# Patient Record
Sex: Female | Born: 1945
Health system: Southern US, Community
[De-identification: ages and names within clinical notes are randomized; demographics above are authoritative.]

## PROBLEM LIST (undated history)

## (undated) DIAGNOSIS — J189 Pneumonia, unspecified organism: Secondary | ICD-10-CM

## (undated) DIAGNOSIS — J449 Chronic obstructive pulmonary disease, unspecified: Secondary | ICD-10-CM

## (undated) HISTORY — PX: APPENDECTOMY: SHX54

## (undated) HISTORY — PX: FRACTURE SURGERY: SHX138

## (undated) HISTORY — PX: TUBAL LIGATION: SHX77

---

## 2000-10-29 ENCOUNTER — Encounter: Payer: Self-pay | Admitting: Otolaryngology

## 2000-10-29 ENCOUNTER — Ambulatory Visit (HOSPITAL_COMMUNITY): Admission: RE | Admit: 2000-10-29 | Discharge: 2000-10-29 | Payer: Self-pay | Admitting: Otolaryngology

## 2001-03-27 ENCOUNTER — Encounter: Payer: Self-pay | Admitting: *Deleted

## 2001-03-27 ENCOUNTER — Emergency Department (HOSPITAL_COMMUNITY): Admission: EM | Admit: 2001-03-27 | Discharge: 2001-03-27 | Payer: Self-pay | Admitting: *Deleted

## 2001-03-29 ENCOUNTER — Inpatient Hospital Stay (HOSPITAL_COMMUNITY): Admission: EM | Admit: 2001-03-29 | Discharge: 2001-03-30 | Payer: Self-pay | Admitting: *Deleted

## 2001-04-01 ENCOUNTER — Other Ambulatory Visit (HOSPITAL_COMMUNITY): Admission: RE | Admit: 2001-04-01 | Discharge: 2001-06-30 | Payer: Self-pay | Admitting: *Deleted

## 2002-12-01 ENCOUNTER — Encounter: Payer: Self-pay | Admitting: Urology

## 2002-12-01 ENCOUNTER — Ambulatory Visit (HOSPITAL_COMMUNITY): Admission: RE | Admit: 2002-12-01 | Discharge: 2002-12-01 | Payer: Self-pay | Admitting: Urology

## 2002-12-15 ENCOUNTER — Encounter: Payer: Self-pay | Admitting: Urology

## 2002-12-15 ENCOUNTER — Ambulatory Visit (HOSPITAL_COMMUNITY): Admission: RE | Admit: 2002-12-15 | Discharge: 2002-12-15 | Payer: Self-pay

## 2003-06-30 ENCOUNTER — Ambulatory Visit (HOSPITAL_COMMUNITY): Admission: RE | Admit: 2003-06-30 | Discharge: 2003-06-30 | Payer: Self-pay | Admitting: Urology

## 2006-05-13 ENCOUNTER — Emergency Department (HOSPITAL_COMMUNITY): Admission: EM | Admit: 2006-05-13 | Discharge: 2006-05-14 | Payer: Self-pay | Admitting: Emergency Medicine

## 2014-07-17 DIAGNOSIS — Z136 Encounter for screening for cardiovascular disorders: Secondary | ICD-10-CM | POA: Diagnosis not present

## 2014-07-17 DIAGNOSIS — Z6820 Body mass index (BMI) 20.0-20.9, adult: Secondary | ICD-10-CM | POA: Diagnosis not present

## 2014-07-17 DIAGNOSIS — Z0000 Encounter for general adult medical examination without abnormal findings: Secondary | ICD-10-CM | POA: Diagnosis not present

## 2014-07-17 DIAGNOSIS — Z13220 Encounter for screening for lipoid disorders: Secondary | ICD-10-CM | POA: Diagnosis not present

## 2014-07-20 ENCOUNTER — Other Ambulatory Visit (HOSPITAL_COMMUNITY): Payer: Self-pay | Admitting: Family Medicine

## 2014-07-20 DIAGNOSIS — Z0000 Encounter for general adult medical examination without abnormal findings: Secondary | ICD-10-CM

## 2014-07-23 ENCOUNTER — Other Ambulatory Visit (HOSPITAL_COMMUNITY): Payer: Self-pay | Admitting: Family Medicine

## 2014-07-23 DIAGNOSIS — N951 Menopausal and female climacteric states: Secondary | ICD-10-CM

## 2014-07-23 DIAGNOSIS — Z0000 Encounter for general adult medical examination without abnormal findings: Secondary | ICD-10-CM

## 2014-07-28 ENCOUNTER — Other Ambulatory Visit (HOSPITAL_COMMUNITY): Payer: Self-pay

## 2014-07-31 ENCOUNTER — Other Ambulatory Visit (HOSPITAL_COMMUNITY): Payer: Self-pay

## 2014-12-17 DIAGNOSIS — I781 Nevus, non-neoplastic: Secondary | ICD-10-CM | POA: Diagnosis not present

## 2014-12-17 DIAGNOSIS — L723 Sebaceous cyst: Secondary | ICD-10-CM | POA: Diagnosis not present

## 2014-12-17 DIAGNOSIS — L309 Dermatitis, unspecified: Secondary | ICD-10-CM | POA: Diagnosis not present

## 2016-02-05 ENCOUNTER — Emergency Department (HOSPITAL_COMMUNITY): Payer: Medicare Other

## 2016-02-05 ENCOUNTER — Encounter (HOSPITAL_COMMUNITY): Payer: Self-pay

## 2016-02-05 ENCOUNTER — Emergency Department (HOSPITAL_COMMUNITY)
Admission: EM | Admit: 2016-02-05 | Discharge: 2016-02-05 | Disposition: A | Discharge status: Home / Self Care | Payer: Medicare Other | Attending: Emergency Medicine | Admitting: Emergency Medicine

## 2016-02-05 DIAGNOSIS — F17210 Nicotine dependence, cigarettes, uncomplicated: Secondary | ICD-10-CM | POA: Insufficient documentation

## 2016-02-05 DIAGNOSIS — Y998 Other external cause status: Secondary | ICD-10-CM | POA: Insufficient documentation

## 2016-02-05 DIAGNOSIS — Y9368 Activity, volleyball (beach) (court): Secondary | ICD-10-CM | POA: Insufficient documentation

## 2016-02-05 DIAGNOSIS — S59292A Other physeal fracture of lower end of radius, left arm, initial encounter for closed fracture: Secondary | ICD-10-CM | POA: Diagnosis not present

## 2016-02-05 DIAGNOSIS — Y929 Unspecified place or not applicable: Secondary | ICD-10-CM | POA: Insufficient documentation

## 2016-02-05 DIAGNOSIS — Z7982 Long term (current) use of aspirin: Secondary | ICD-10-CM | POA: Diagnosis not present

## 2016-02-05 DIAGNOSIS — S52572A Other intraarticular fracture of lower end of left radius, initial encounter for closed fracture: Secondary | ICD-10-CM | POA: Insufficient documentation

## 2016-02-05 DIAGNOSIS — W19XXXA Unspecified fall, initial encounter: Secondary | ICD-10-CM | POA: Insufficient documentation

## 2016-02-05 DIAGNOSIS — Z79899 Other long term (current) drug therapy: Secondary | ICD-10-CM | POA: Insufficient documentation

## 2016-02-05 DIAGNOSIS — S62102A Fracture of unspecified carpal bone, left wrist, initial encounter for closed fracture: Secondary | ICD-10-CM

## 2016-02-05 DIAGNOSIS — S6992XA Unspecified injury of left wrist, hand and finger(s), initial encounter: Secondary | ICD-10-CM | POA: Diagnosis present

## 2016-02-05 MED ORDER — HYDROCODONE-ACETAMINOPHEN 5-325 MG PO TABS
1.0000 | ORAL_TABLET | Freq: Once | ORAL | Status: AC
  Administered 2016-02-05: 1 via ORAL
  Filled 2016-02-05: qty 1

## 2016-02-05 MED ORDER — HYDROCODONE-ACETAMINOPHEN 5-325 MG PO TABS: ORAL_TABLET | ORAL | 0 refills | Status: DC

## 2016-02-05 NOTE — ED Provider Notes (Signed)
[facility] DEPT Provider Note   CSN: [id number] Arrival date & time: 02/05/16  1859     History   Chief Complaint Chief Complaint  Patient presents with  . Wrist Pain    HPI Kara Smith is a 70 y.o. female.  HPI  Kara Smith is a 70 y.o. female who presents to the Emergency Department complaining of left wrist pain after a mechanical fall onto an outstretched hand.  patient states she was playing volleyball at the time of the accident.  She states that she wrapped her wrist in an ice pack and took a NSAID prior to arrival.  She reports swelling of the wrist and pain to the medial wrist that's worse with movement.  She denies numbness or pain of the fingers, elbow pain or other injuries. Also denies anti-coagulants.  History reviewed. No pertinent past medical history.  There are no active problems to display for this patient.   Past Surgical History:  Procedure Laterality Date  . APPENDECTOMY      OB History    No data available       Home Medications    Prior to Admission medications   Medication Sig Start Date End Date Taking? Authorizing Provider  aspirin EC 325 MG tablet Take 325 mg by mouth daily.   Yes Historical Provider, MD  Multiple Vitamin (MULTIVITAMIN WITH MINERALS) TABS tablet Take 1 tablet by mouth daily.   Yes Historical Provider, MD  naproxen sodium (ALEVE) 220 MG tablet Take 220-440 mg by mouth daily as needed (for pain).   Yes Historical Provider, MD  Omega-3 Fatty Acids (FISH OIL) 1000 MG CAPS Take 1 capsule by mouth daily.   Yes Historical Provider, MD  SUPER B COMPLEX/C PO Take 1 tablet by mouth daily.   Yes Historical Provider, MD    Family History No family history on file.  Social History Social History  Substance Use Topics  . Smoking status: Current Every Day Smoker    Packs/day: 0.50    Types: Cigarettes  . Smokeless tobacco: Never Used  . Alcohol use No     Allergies   Sulfa antibiotics   Review of  Systems Review of Systems  Constitutional: Negative for chills and fever.  Musculoskeletal: Positive for arthralgias (left wrist pain) and joint swelling. Negative for back pain and neck pain.  Skin: Negative for color change and wound.  Neurological: Negative for syncope, weakness and numbness.  All other systems reviewed and are negative.    Physical Exam Updated Vital Signs BP 149/74 (BP Location: Left Arm)   Pulse 83   Temp 98.2 F (36.8 C)   Resp 20   Ht 5\' 7"  (1.702 m)   Wt 54.4 kg   SpO2 100%   BMI 18.79 kg/m   Physical Exam  Constitutional: She is oriented to person, place, and time. She appears well-developed and well-nourished. No distress.  HENT:  Head: Normocephalic and atraumatic.  Cardiovascular: Normal rate, regular rhythm and intact distal pulses.   Pulmonary/Chest: Effort normal and breath sounds normal.  Musculoskeletal: She exhibits edema and tenderness.  ttp and edema of the radial aspect of the left wrist.   Radial pulse is brisk, distal sensation intact.  CR< 2 sec.  No bruising or open wounds.  Compartments soft.  Neurological: She is alert and oriented to person, place, and time. She exhibits normal muscle tone. Coordination normal.  Skin: Skin is warm and dry.  Nursing note and vitals reviewed.    ED  Treatments / Results  Labs (all labs ordered are listed, but only abnormal results are displayed) Labs Reviewed - No data to display  EKG  EKG Interpretation None       Radiology Dg Wrist Complete Left  Result Date: 02/05/2016 CLINICAL DATA:  Larey SeatFell onto LEFT wrist while playing volleyball today, pain and swelling LEFT wrist EXAM: LEFT WRIST - COMPLETE 3+ VIEW COMPARISON:  None FINDINGS: Diffuse osseous demineralization. Joint spaces preserved. Ossicle seen adjacent to tip of ulnar styloid, appears corticated, question non fused ossicle versus sequela of remote injury. Comminuted distal LEFT radial metaphyseal fracture with dorsal tilt of distal  radial articular surface, displacement of fragments, and intra-articular extension at the radiocarpal joint. No additional fracture, dislocation, or bone destruction. Associated soft tissue swelling. IMPRESSION: Comminuted displaced intra-articular distal LEFT radial metaphyseal fracture. Non fused ossicle versus nonunion of an old ulnar styloid fragment, appears old. Electronically Signed   By: Ulyses SouthwardMark  Boles M.D.   On: 02/05/2016 20:04    Procedures Procedures (including critical care time)  Medications Ordered in ED Medications  HYDROcodone-acetaminophen (NORCO/VICODIN) 5-325 MG per tablet 1 tablet (not administered)     Initial Impression / Assessment and Plan / ED Course  I have reviewed the triage vital signs and the nursing notes.  Pertinent labs & imaging results that were available during my care of the patient were reviewed by me and considered in my medical decision making (see chart for details).  Clinical Course   Pt well appearing, no distress.  XR results discussed with patient.  NV intact  ED attending, Dr. Hyacinth MeekerMiller consulted hand surgeon on call, Dr. Janee Mornhompson and care plan discussed to include sugar tong splint, sling and Dr. Carollee Massedhompson's office to contact pt to set up f/u  Sugar tong splint applied, sling given,  Remains NV intact.    Final Clinical Impressions(s) / ED Diagnoses   Final diagnoses:  Wrist fracture, left, closed, initial encounter    New Prescriptions Discharge Medication List as of 02/05/2016  9:32 PM    START taking these medications   Details  HYDROcodone-acetaminophen (NORCO/VICODIN) 5-325 MG tablet Take one tab po q 4-6 hrs prn pain, Print         Pauline Ausammy Moani Weipert, PA-C 02/07/16 1237    Eber HongBrian Miller, MD 02/09/16 1006

## 2016-02-05 NOTE — ED Triage Notes (Signed)
Patient states she was playing volleyball today and fell onto her left wrist. C/o pain and swelling to left wrist.

## 2016-02-05 NOTE — Discharge Instructions (Signed)
Elevate and apply ice packs on/off to your wrist.  Keep it splinted.  Dr. Carollee Massedhompson's office will call you on Monday to arrange a follow-up appt.

## 2016-02-05 NOTE — ED Provider Notes (Signed)
The patient fell on an outstretched hand while she was playing volleyball, had acute onset of pain deformity and swelling around the distal left wrist. On exam the patient is tender, decreased range of motion, x-rays reviewed, they show comminuted intra-articular left distal radius fracture. I discussed the case with Dr. Janee Mornhompson, he will have the office contact the patient for close follow-up.  Sugar tong, f/u - pt in agreement  RICE therapy.  Medical screening examination/treatment/procedure(s) were conducted as a shared visit with non-physician practitioner(s) and myself.  I personally evaluated the patient during the encounter.  Clinical Impression:   Final diagnoses:  Wrist fracture, left, closed, initial encounter         Eber HongBrian Alexismarie Flaim, MD 02/09/16 1005

## 2016-02-07 ENCOUNTER — Other Ambulatory Visit: Payer: Self-pay | Admitting: Orthopedic Surgery

## 2016-02-07 DIAGNOSIS — S52572A Other intraarticular fracture of lower end of left radius, initial encounter for closed fracture: Secondary | ICD-10-CM | POA: Diagnosis not present

## 2016-02-08 ENCOUNTER — Ambulatory Visit (HOSPITAL_BASED_OUTPATIENT_CLINIC_OR_DEPARTMENT_OTHER)
Admission: RE | Admit: 2016-02-08 | Discharge: 2016-02-08 | Disposition: A | Discharge status: Home / Self Care | Payer: Medicare Other | Source: Ambulatory Visit | Attending: Orthopedic Surgery | Admitting: Orthopedic Surgery

## 2016-02-08 ENCOUNTER — Ambulatory Visit (HOSPITAL_BASED_OUTPATIENT_CLINIC_OR_DEPARTMENT_OTHER): Payer: Medicare Other | Admitting: Anesthesiology

## 2016-02-08 ENCOUNTER — Encounter (HOSPITAL_BASED_OUTPATIENT_CLINIC_OR_DEPARTMENT_OTHER)
Admission: RE | Disposition: A | Discharge status: Home / Self Care | Payer: Self-pay | Source: Ambulatory Visit | Attending: Orthopedic Surgery

## 2016-02-08 ENCOUNTER — Ambulatory Visit (HOSPITAL_COMMUNITY): Payer: Medicare Other

## 2016-02-08 ENCOUNTER — Encounter (HOSPITAL_BASED_OUTPATIENT_CLINIC_OR_DEPARTMENT_OTHER): Payer: Self-pay | Admitting: Anesthesiology

## 2016-02-08 DIAGNOSIS — F17210 Nicotine dependence, cigarettes, uncomplicated: Secondary | ICD-10-CM | POA: Diagnosis not present

## 2016-02-08 DIAGNOSIS — T148XXA Other injury of unspecified body region, initial encounter: Secondary | ICD-10-CM

## 2016-02-08 DIAGNOSIS — S52572A Other intraarticular fracture of lower end of left radius, initial encounter for closed fracture: Secondary | ICD-10-CM | POA: Diagnosis not present

## 2016-02-08 DIAGNOSIS — W19XXXA Unspecified fall, initial encounter: Secondary | ICD-10-CM | POA: Insufficient documentation

## 2016-02-08 DIAGNOSIS — Z7982 Long term (current) use of aspirin: Secondary | ICD-10-CM | POA: Insufficient documentation

## 2016-02-08 DIAGNOSIS — X58XXXA Exposure to other specified factors, initial encounter: Secondary | ICD-10-CM | POA: Diagnosis not present

## 2016-02-08 DIAGNOSIS — S52552A Other extraarticular fracture of lower end of left radius, initial encounter for closed fracture: Secondary | ICD-10-CM | POA: Diagnosis not present

## 2016-02-08 DIAGNOSIS — M79632 Pain in left forearm: Secondary | ICD-10-CM | POA: Diagnosis not present

## 2016-02-08 DIAGNOSIS — G8918 Other acute postprocedural pain: Secondary | ICD-10-CM | POA: Diagnosis not present

## 2016-02-08 HISTORY — PX: OPEN REDUCTION INTERNAL FIXATION (ORIF) DISTAL RADIAL FRACTURE: SHX5989

## 2016-02-08 SURGERY — OPEN REDUCTION INTERNAL FIXATION (ORIF) DISTAL RADIUS FRACTURE
Anesthesia: General | Site: Wrist | Laterality: Left

## 2016-02-08 MED ORDER — PROPOFOL 10 MG/ML IV BOLUS
INTRAVENOUS | Status: DC | PRN
  Administered 2016-02-08: 20 mg via INTRAVENOUS
  Administered 2016-02-08: 1150 mg via INTRAVENOUS

## 2016-02-08 MED ORDER — PROMETHAZINE HCL 25 MG/ML IJ SOLN: 6.2500 mg | INTRAMUSCULAR | Status: DC | PRN

## 2016-02-08 MED ORDER — CEFAZOLIN SODIUM-DEXTROSE 2-4 GM/100ML-% IV SOLN
INTRAVENOUS | Status: AC
  Filled 2016-02-08: qty 100

## 2016-02-08 MED ORDER — FENTANYL CITRATE (PF) 100 MCG/2ML IJ SOLN
INTRAMUSCULAR | Status: AC
  Filled 2016-02-08: qty 2

## 2016-02-08 MED ORDER — CEFAZOLIN SODIUM-DEXTROSE 2-4 GM/100ML-% IV SOLN
2.0000 g | INTRAVENOUS | Status: AC
  Administered 2016-02-08: 2 g via INTRAVENOUS

## 2016-02-08 MED ORDER — LIDOCAINE HCL (PF) 1 % IJ SOLN
INTRAMUSCULAR | Status: AC
  Filled 2016-02-08: qty 30

## 2016-02-08 MED ORDER — DEXAMETHASONE SODIUM PHOSPHATE 10 MG/ML IJ SOLN
INTRAMUSCULAR | Status: AC
  Filled 2016-02-08: qty 1

## 2016-02-08 MED ORDER — GLYCOPYRROLATE 0.2 MG/ML IJ SOLN: 0.2000 mg | Freq: Once | INTRAMUSCULAR | Status: DC | PRN

## 2016-02-08 MED ORDER — MEPERIDINE HCL 25 MG/ML IJ SOLN: 6.2500 mg | INTRAMUSCULAR | Status: DC | PRN

## 2016-02-08 MED ORDER — BUPIVACAINE-EPINEPHRINE (PF) 0.5% -1:200000 IJ SOLN
INTRAMUSCULAR | Status: DC | PRN
  Administered 2016-02-08: 30 mL via PERINEURAL

## 2016-02-08 MED ORDER — LACTATED RINGERS IV SOLN: INTRAVENOUS | Status: DC

## 2016-02-08 MED ORDER — SCOPOLAMINE 1 MG/3DAYS TD PT72: 1.0000 | MEDICATED_PATCH | Freq: Once | TRANSDERMAL | Status: DC | PRN

## 2016-02-08 MED ORDER — LACTATED RINGERS IV SOLN
INTRAVENOUS | Status: DC
  Administered 2016-02-08: 11:00:00 via INTRAVENOUS

## 2016-02-08 MED ORDER — BUPIVACAINE-EPINEPHRINE (PF) 0.5% -1:200000 IJ SOLN
INTRAMUSCULAR | Status: AC
  Filled 2016-02-08: qty 30

## 2016-02-08 MED ORDER — MIDAZOLAM HCL 2 MG/2ML IJ SOLN
INTRAMUSCULAR | Status: AC
  Filled 2016-02-08: qty 2

## 2016-02-08 MED ORDER — OXYCODONE HCL 5 MG PO TABS: 5.0000 mg | ORAL_TABLET | Freq: Once | ORAL | Status: DC | PRN

## 2016-02-08 MED ORDER — LIDOCAINE 2% (20 MG/ML) 5 ML SYRINGE
INTRAMUSCULAR | Status: DC | PRN
  Administered 2016-02-08: 60 mg via INTRAVENOUS

## 2016-02-08 MED ORDER — OXYCODONE HCL 5 MG/5ML PO SOLN: 5.0000 mg | Freq: Once | ORAL | Status: DC | PRN

## 2016-02-08 MED ORDER — PROPOFOL 10 MG/ML IV BOLUS
INTRAVENOUS | Status: AC
  Filled 2016-02-08: qty 20

## 2016-02-08 MED ORDER — ONDANSETRON HCL 4 MG/2ML IJ SOLN
INTRAMUSCULAR | Status: AC
  Filled 2016-02-08: qty 2

## 2016-02-08 MED ORDER — MIDAZOLAM HCL 2 MG/2ML IJ SOLN
1.0000 mg | INTRAMUSCULAR | Status: DC | PRN
  Administered 2016-02-08: 1 mg via INTRAVENOUS

## 2016-02-08 MED ORDER — FENTANYL CITRATE (PF) 100 MCG/2ML IJ SOLN
50.0000 ug | INTRAMUSCULAR | Status: DC | PRN
  Administered 2016-02-08: 50 ug via INTRAVENOUS

## 2016-02-08 MED ORDER — HYDROCODONE-ACETAMINOPHEN 5-325 MG PO TABS: ORAL_TABLET | ORAL | 0 refills | Status: DC

## 2016-02-08 MED ORDER — HYDROMORPHONE HCL 1 MG/ML IJ SOLN: 0.2500 mg | INTRAMUSCULAR | Status: DC | PRN

## 2016-02-08 MED ORDER — 0.9 % SODIUM CHLORIDE (POUR BTL) OPTIME
TOPICAL | Status: DC | PRN
  Administered 2016-02-08: 1000 mL

## 2016-02-08 MED ORDER — LIDOCAINE 2% (20 MG/ML) 5 ML SYRINGE
INTRAMUSCULAR | Status: AC
  Filled 2016-02-08: qty 5

## 2016-02-08 SURGICAL SUPPLY — 66 items
BANDAGE COBAN STERILE 2 (GAUZE/BANDAGES/DRESSINGS) IMPLANT
BIT DRILL SOLID 2.0X40MM (BIT) IMPLANT
BIT DRILL SOLID 2.5X40MM (BIT) IMPLANT
BLADE MINI RND TIP GREEN BEAV (BLADE) IMPLANT
BLADE SURG 15 STRL LF DISP TIS (BLADE) ×1 IMPLANT
BLADE SURG 15 STRL SS (BLADE) ×2
BNDG COHESIVE 4X5 TAN STRL (GAUZE/BANDAGES/DRESSINGS) ×3 IMPLANT
BNDG ESMARK 4X9 LF (GAUZE/BANDAGES/DRESSINGS) ×3 IMPLANT
BNDG GAUZE ELAST 4 BULKY (GAUZE/BANDAGES/DRESSINGS) ×3 IMPLANT
BRUSH SCRUB EZ PLAIN DRY (MISCELLANEOUS) IMPLANT
CANISTER SUCT 1200ML W/VALVE (MISCELLANEOUS) ×3 IMPLANT
CHLORAPREP W/TINT 26ML (MISCELLANEOUS) ×3 IMPLANT
CORDS BIPOLAR (ELECTRODE) ×3 IMPLANT
COVER BACK TABLE 60X90IN (DRAPES) ×3 IMPLANT
COVER MAYO STAND STRL (DRAPES) ×3 IMPLANT
CUFF TOURNIQUET SINGLE 18IN (TOURNIQUET CUFF) IMPLANT
CUFF TOURNIQUET SINGLE 24IN (TOURNIQUET CUFF) IMPLANT
DRAPE C-ARM 42X72 X-RAY (DRAPES) ×3 IMPLANT
DRAPE EXTREMITY T 121X128X90 (DRAPE) ×3 IMPLANT
DRAPE SURG 17X23 STRL (DRAPES) ×3 IMPLANT
DRILL SOLID 2.0X40MM (BIT)
DRILL SOLID 2.5X40MM (BIT)
DRILL, CANNULATED, POLYAXIAL LOCKING SCREW 2.0 MM ×3 IMPLANT
DRSG ADAPTIC 3X8 NADH LF (GAUZE/BANDAGES/DRESSINGS) ×3 IMPLANT
DRSG EMULSION OIL 3X3 NADH (GAUZE/BANDAGES/DRESSINGS) IMPLANT
ELECT REM PT RETURN 9FT ADLT (ELECTROSURGICAL) ×3
ELECTRODE REM PT RTRN 9FT ADLT (ELECTROSURGICAL) ×1 IMPLANT
GAUZE SPONGE 4X4 12PLY STRL (GAUZE/BANDAGES/DRESSINGS) ×3 IMPLANT
GLOVE BIO SURGEON STRL SZ7.5 (GLOVE) ×3 IMPLANT
GLOVE BIOGEL PI IND STRL 7.0 (GLOVE) ×1 IMPLANT
GLOVE BIOGEL PI IND STRL 8 (GLOVE) ×1 IMPLANT
GLOVE BIOGEL PI INDICATOR 7.0 (GLOVE) ×2
GLOVE BIOGEL PI INDICATOR 8 (GLOVE) ×2
GLOVE ECLIPSE 6.5 STRL STRAW (GLOVE) ×3 IMPLANT
GOWN STRL REUS W/ TWL LRG LVL3 (GOWN DISPOSABLE) ×2 IMPLANT
GOWN STRL REUS W/TWL LRG LVL3 (GOWN DISPOSABLE) ×4
GOWN STRL REUS W/TWL XL LVL3 (GOWN DISPOSABLE) ×3 IMPLANT
NEEDLE HYPO 25X1 1.5 SAFETY (NEEDLE) IMPLANT
NS IRRIG 1000ML POUR BTL (IV SOLUTION) ×3 IMPLANT
PACK BASIN DAY SURGERY FS (CUSTOM PROCEDURE TRAY) ×3 IMPLANT
PADDING CAST ABS 4INX4YD NS (CAST SUPPLIES)
PADDING CAST ABS COTTON 4X4 ST (CAST SUPPLIES) IMPLANT
PENCIL BUTTON HOLSTER BLD 10FT (ELECTRODE) ×3 IMPLANT
RUBBERBAND STERILE (MISCELLANEOUS) IMPLANT
SKELETAL DYNAMICS DVR SET (Set) ×3 IMPLANT
SLEEVE SCD COMPRESS KNEE MED (MISCELLANEOUS) ×3 IMPLANT
SLING ARM FOAM STRAP LRG (SOFTGOODS) IMPLANT
SPLINT PLASTER CAST XFAST 3X15 (CAST SUPPLIES) IMPLANT
SPLINT PLASTER XTRA FASTSET 3X (CAST SUPPLIES)
STOCKINETTE 6  STRL (DRAPES) ×2
STOCKINETTE 6 STRL (DRAPES) ×1 IMPLANT
SUCTION FRAZIER HANDLE 10FR (MISCELLANEOUS) ×2
SUCTION TUBE FRAZIER 10FR DISP (MISCELLANEOUS) ×1 IMPLANT
SUT VIC AB 2-0 PS2 27 (SUTURE) ×3 IMPLANT
SUT VICRYL 4-0 PS2 18IN ABS (SUTURE) IMPLANT
SUT VICRYL RAPIDE 4-0 (SUTURE) IMPLANT
SUT VICRYL RAPIDE 4/0 PS 2 (SUTURE) ×3 IMPLANT
SYR BULB 3OZ (MISCELLANEOUS) ×3 IMPLANT
SYRINGE 10CC LL (SYRINGE) IMPLANT
TOWEL OR 17X24 6PK STRL BLUE (TOWEL DISPOSABLE) ×3 IMPLANT
TOWEL OR NON WOVEN STRL DISP B (DISPOSABLE) ×3 IMPLANT
TUBE CONNECTING 20'X1/4 (TUBING) ×1
TUBE CONNECTING 20X1/4 (TUBING) ×2 IMPLANT
UNDERPAD 30X30 (UNDERPADS AND DIAPERS) ×3 IMPLANT
WIRE FIX 1.5 STANDARD TIP (WIRE)
WIRE FIX 1.5 STD TIP (WIRE) IMPLANT

## 2016-02-08 NOTE — Transfer of Care (Signed)
Immediate Anesthesia Transfer of Care Note  Patient: Kara MassonJudith T Smith  Procedure(s) Performed: Procedure(s) with comments: OPEN TREATMENT OF LEFT DISTAL RADIUS FRACTURE (Left) - GENERAL ANESTHESIA WITH PRE-OP BLOCK  Patient Location: PACU  Anesthesia Type:GA combined with regional for post-op pain  Level of Consciousness: awake, sedated and patient cooperative  Airway & Oxygen Therapy: Patient Spontanous Breathing and Patient connected to face mask oxygen  Post-op Assessment: Report given to RN and Post -op Vital signs reviewed and stable  Post vital signs: Reviewed and stable  Last Vitals:  Vitals:   02/08/16 1200 02/08/16 1215  BP: (!) 110/49 (!) 108/54  Pulse: 93 84  Resp: (!) 21 16  Temp:      Last Pain:  Vitals:   02/08/16 1051  TempSrc: Oral  PainSc: 1       Patients Stated Pain Goal: 0 (02/08/16 1051)  Complications: No apparent anesthesia complications

## 2016-02-08 NOTE — Progress Notes (Signed)
Assisted Dr. Hollis with left, ultrasound guided, supraclavicular block. Side rails up, monitors on throughout procedure. See vital signs in flow sheet. Tolerated Procedure well. 

## 2016-02-08 NOTE — H&P (Signed)
Kara Smith is an 71 y.o. female.   CC / Reason for Visit: Left distal radius fracture HPI: This patient is a 70 year old, right-hand-dominant, female who indicates that she was playing volleyball when she fell onto an outstretched hand on Saturday, 02/05/2016.  She was seen at the emergency department where x-rays were taken and she was found to have a distal radius fracture.  She was placed into a sugar tong splint and referred to [place] for further evaluation and treatment.  She reports that she has no other health problems and that she is not a diabetic.  She indicates that she has been taking aspirin and Tylenol for pain but has some hydrocodone that she was provided by the emergency department.  History reviewed. No pertinent past medical history.  Past Surgical History:  Procedure Laterality Date  . APPENDECTOMY      History reviewed. No pertinent family history. Social History:  reports that she has been smoking Cigarettes.  She has been smoking about 0.50 packs per day. She has never used smokeless tobacco. She reports that she does not drink alcohol or use drugs.  Allergies:  Allergies  Allergen Reactions  . Sulfa Antibiotics Rash    Medications Prior to Admission  Medication Sig Dispense Refill  . aspirin EC 325 MG tablet Take 325 mg by mouth daily.    Marland Kitchen HYDROcodone-acetaminophen (NORCO/VICODIN) 5-325 MG tablet Take one tab po q 4-6 hrs prn pain 20 tablet 0  . Multiple Vitamin (MULTIVITAMIN WITH MINERALS) TABS tablet Take 1 tablet by mouth daily.    . naproxen sodium (ALEVE) 220 MG tablet Take 220-440 mg by mouth daily as needed (for pain).    . Omega-3 Fatty Acids (FISH OIL) 1000 MG CAPS Take 1 capsule by mouth daily.    . SUPER B COMPLEX/C PO Take 1 tablet by mouth daily.      No results found for this or any previous visit (from the past 48 hour(s)). No results found.  Review of Systems  All other systems reviewed and are negative.   Blood pressure (!) 121/59, pulse  76, temperature 97.7 F (36.5 C), temperature source Oral, resp. rate 18, height 5\' 7"  (1.702 m), weight 51.3 kg (113 lb), SpO2 100 %. Physical Exam  Constitutional:  WD, WN, NAD HEENT:  NCAT, EOMI Neuro/Psych:  Alert & oriented to person, place, and time; appropriate mood & affect Lymphatic: No generalized UE edema or lymphadenopathy Extremities / MSK:  Both UE are normal with respect to appearance, ranges of motion, joint stability, muscle strength/tone, sensation, & perfusion except as otherwise noted:  The digits on the left hand are swollen and ecchymotic.  There warm to the touch in the patient has good capillary refill and intact light touch sensibility.  She is able to flex and extend the digits within the confines of the sugar tong splint.  Labs / Xrays:  No radiographic studies obtained today.  X-rays from 02/05/2016 that were ordered and obtained at the emergency department including 4 views of the left wrist demonstrated a closed, intra-articular, comminuted, and dorsally angulated distal radius fracture.  Assessment: Left distal radius fracture  Plan:  Findings are discussed with the patient as well as her husband.  She plans to proceed with left distal radius ORIF tomorrow.  The details of the operative procedure were discussed with the patient.  Questions were invited and answered.  In addition to the goal of the procedure, the risks of the procedure to include but not limited  to bleeding; infection; damage to the nerves or blood vessels that could result in bleeding, numbness, weakness, chronic pain, and the need for additional procedures; stiffness; the need for revision surgery; and anesthetic risks were reviewed.  No specific outcome was guaranteed or implied.  Informed consent was obtained.   Laney Louderback A., MD 02/08/2016, 11:10 AM

## 2016-02-08 NOTE — Interval H&P Note (Signed)
History and Physical Interval Note:  02/08/2016 12:50 PM  Kara MassonJudith T Smith  has presented today for surgery, with the diagnosis of LEFT DISTAL RADIUS FRACTURE S52.572A  The various methods of treatment have been discussed with the patient and family. After consideration of risks, benefits and other options for treatment, the patient has consented to  Procedure(s) with comments: OPEN TREATMENT OF LEFT DISTAL RADIUS FRACTURE (Left) - GENERAL ANESTHESIA WITH PRE-OP BLOCK as a surgical intervention .  The patient's history has been reviewed, patient examined, no change in status, stable for surgery.  I have reviewed the patient's chart and labs.  Questions were answered to the patient's satisfaction.     Aarav Burgett A.

## 2016-02-08 NOTE — Op Note (Signed)
02/08/2016  12:51 PM  PATIENT:  Kara MassonJudith T Smith  70 y.o. female  PRE-OPERATIVE DIAGNOSIS:  Displaced left extra-articular distal radius fracture  POST-OPERATIVE DIAGNOSIS:  Same  PROCEDURE:  ORIF left DRFx 25607   SURGEON: Cliffton Astersavid A. Janee Mornhompson, MD  PHYSICIAN ASSISTANT: Danielle RankinKirsten Schrader, OPA-C  ANESTHESIA:  regional and general  SPECIMENS:  None  DRAINS: None  EBL:  less than 50 mL  PREOPERATIVE INDICATIONS:  Kara MassonJudith T Smith is a  70 y.o. female with a displaced left distal radius fracture  The risks benefits and alternatives were discussed with the patient preoperatively including but not limited to the risks of infection, bleeding, nerve injury, cardiopulmonary complications, the need for revision surgery, among others, and the patient verbalized understanding and consented to proceed.  OPERATIVE IMPLANTS: [employer] distal radius plate/screws/pegs  OPERATIVE PROCEDURE: After receiving prophylactic antibiotics and a regional block, the patient was escorted to the operative theatre and placed in a supine position. General anesthesia was administered.  A surgical "time-out" was performed during which the planned procedure, proposed operative site, and the correct patient identity were compared to the operative consent and agreement confirmed by the circulating nurse according to current facility policy. Following application of a tourniquet to the operative extremity, the exposed skin was pre-scrub with Hibiclens scrub brush and then was prepped with Chloraprep and draped in the usual sterile fashion. The limb was exsanguinated with an Esmarch bandage and the tourniquet inflated to approximately 100mmHg higher than systolic BP.   A sinusoidal-shaped incision was marked and made over the FCR axis and the distal forearm. The skin was incised sharply with scalpel, subcutaneous tissues with blunt and spreading dissection. The FCR axis was exploited deeply. The pronator quadratus was  reflected in an L-shaped ulnarly and the brachioradialis was split in a Z-plasty fashion for later reapproximation. The fracture was inspected and provisionally reduced.  This was confirmed fluoroscopically. The appropriately sized plate was selected and found to fit well. It was placed in its provisional alignment of the radius and this was confirmed fluoroscopically.  It was secured to the radius with a screw through the slotted hole.  Additional adjustments were made as necessary, and the distal holes were all drilled and filled.  Peg/screw length distally was selected on the shorter side of measurements to minimize the risk for dorsal cortical penetration. The remainder of the proximal holes were drilled and filled.   Final images were obtained and the DRUJ was examined for stability. It was found to be sufficiently stable. The wound was then copiously irrigated and the brachioradialis repaired with 2-0 Vicryl Rapide suture followed by repair of the pronator quadratus with the same suture type. Tourniquet was released and additional hemostasis obtained and the skin was closed with 2-0 Vicryl deep dermal buried sutures followed by running 4-0 Vicryl Rapide horizontal mattress suture in the skin. A bulky dressing with a volar plaster component was applied and she was taken to room stable condition.  DISPOSITION: The patient will be discharged home today with typical post-op instructions, returning in 10-15 days for reevaluation with new x-rays of the affected wrist out of the splint to include an inclined lateral and then transition to therapy to have a custom splint constructed and begin rehabilitation.

## 2016-02-08 NOTE — Anesthesia Postprocedure Evaluation (Signed)
Anesthesia Post Note  Patient: Kara MassonJudith T Smith  Procedure(s) Performed: Procedure(s) (LRB): OPEN TREATMENT OF LEFT DISTAL RADIUS FRACTURE (Left)  Patient location during evaluation: PACU Anesthesia Type: General Level of consciousness: awake and alert Pain management: pain level controlled Vital Signs Assessment: post-procedure vital signs reviewed and stable Respiratory status: spontaneous breathing, nonlabored ventilation and respiratory function stable Cardiovascular status: blood pressure returned to baseline and stable Postop Assessment: no signs of nausea or vomiting Anesthetic complications: no    Last Vitals:  Vitals:   02/08/16 1430 02/08/16 1530  BP: 125/65 (!) 146/64  Pulse: 70 78  Resp: 16 18  Temp:  37.1 C    Last Pain:  Vitals:   02/08/16 1530  TempSrc:   PainSc: 0-No pain                 Easter Schinke A

## 2016-02-08 NOTE — Anesthesia Procedure Notes (Signed)
Procedure Name: LMA Insertion Date/Time: 02/08/2016 1:00 PM Performed by: Gar GibbonKEETON, Brynnleigh Mcelwee S Pre-anesthesia Checklist: Patient identified, Emergency Drugs available, Suction available and Patient being monitored Patient Re-evaluated:Patient Re-evaluated prior to inductionOxygen Delivery Method: Circle system utilized Preoxygenation: Pre-oxygenation with 100% oxygen Intubation Type: IV induction Ventilation: Mask ventilation without difficulty LMA: LMA inserted LMA Size: 3.0 Number of attempts: 1 Airway Equipment and Method: Bite block Placement Confirmation: positive ETCO2 Tube secured with: Tape Dental Injury: Teeth and Oropharynx as per pre-operative assessment

## 2016-02-08 NOTE — Anesthesia Procedure Notes (Signed)
Anesthesia Regional Block:  Supraclavicular block  Pre-Anesthetic Checklist: ,, timeout performed, Correct Patient, Correct Site, Correct Laterality, Correct Procedure, Correct Position, site marked, Risks and benefits discussed,  Surgical consent,  Pre-op evaluation,  At surgeon's request and post-op pain management  Laterality: Left  Prep: chloraprep       Needles:  Injection technique: Single-shot  Needle Type: Echogenic Needle     Needle Length: 9cm 9 cm Needle Gauge: 21 and 21 G    Additional Needles:  Procedures: ultrasound guided (picture in chart) Supraclavicular block Narrative:  Start time: 02/08/2016 11:20 AM End time: 02/08/2016 11:22 AM Injection made incrementally with aspirations every 5 mL.  Performed by: Personally  Anesthesiologist: Shona SimpsonHOLLIS, Terena Bohan D  Additional Notes: Pt tolerated well. No immediate complications noted.

## 2016-02-08 NOTE — Discharge Instructions (Signed)
Discharge Instructions   You have a dressing with a plaster splint incorporated in it. Move your fingers as much as possible, making a full fist and fully opening the fist. Elevate your hand to reduce pain & swelling of the digits.  Ice over the operative site may be helpful to reduce pain & swelling.  DO NOT USE HEAT. Pain medicine has been prescribed for you.  Use your medicine as needed over the first 48 hours, and then you can begin to taper your use.  You may use Tylenol in place of your prescribed pain medication, but not IN ADDITION to it. Leave the dressing in place until you return to our office.  You may shower, but keep the bandage clean & dry.  You may drive a car when you are off of prescription pain medications and can safely control your vehicle with both hands. Our office will call you to arrange follow-up   Post Anesthesia Home Care Instructions  Activity: Get plenty of rest for the remainder of the day. A responsible adult should stay with you for 24 hours following the procedure.  For the next 24 hours, DO NOT: -Drive a car -Advertising copywriterperate machinery -Drink alcoholic beverages -Take any medication unless instructed by your physician -Make any legal decisions or sign important papers.  Meals: Start with liquid foods such as gelatin or soup. Progress to regular foods as tolerated. Avoid greasy, spicy, heavy foods. If nausea and/or vomiting occur, drink only clear liquids until the nausea and/or vomiting subsides. Call your physician if vomiting continues.  Special Instructions/Symptoms: Your throat may feel dry or sore from the anesthesia or the breathing tube placed in your throat during surgery. If this causes discomfort, gargle with warm salt water. The discomfort should disappear within 24 hours.  If you had a scopolamine patch placed behind your ear for the management of post- operative nausea and/or vomiting:  1. The medication in the patch is effective for 72 hours,  after which it should be removed.  Wrap patch in a tissue and discard in the trash. Wash hands thoroughly with soap and water. 2. You may remove the patch earlier than 72 hours if you experience unpleasant side effects which may include dry mouth, dizziness or visual disturbances. 3. Avoid touching the patch. Wash your hands with soap and water after contact with the patch.   Regional Anesthesia Blocks  1. Numbness or the inability to move the "blocked" extremity may last from 3-48 hours after placement. The length of time depends on the medication injected and your individual response to the medication. If the numbness is not going away after 48 hours, call your surgeon.  2. The extremity that is blocked will need to be protected until the numbness is gone and the  Strength has returned. Because you cannot feel it, you will need to take extra care to avoid injury. Because it may be weak, you may have difficulty moving it or using it. You may not know what position it is in without looking at it while the block is in effect.  3. For blocks in the legs and feet, returning to weight bearing and walking needs to be done carefully. You will need to wait until the numbness is entirely gone and the strength has returned. You should be able to move your leg and foot normally before you try and bear weight or walk. You will need someone to be with you when you first try to ensure you do not fall  and possibly risk injury.  4. Bruising and tenderness at the needle site are common side effects and will resolve in a few days.  5. Persistent numbness or new problems with movement should be communicated to the surgeon or the [facility] [phone] [facility] [phone]).  Please call [phone] during normal business hours or [phone] after hours for any problems. Including the following:  - excessive redness of the incisions - drainage for more than 4 days - fever of  more than 101.5 F  *Please note that pain medications will not be refilled after hours or on weekends.

## 2016-02-08 NOTE — Anesthesia Preprocedure Evaluation (Addendum)
Anesthesia Evaluation  Patient identified by MRN, date of birth, ID band Patient awake    Reviewed: Allergy & Precautions, NPO status , Patient's Chart, lab work & pertinent test results  Airway Mallampati: IV   Neck ROM: Full  Mouth opening: Limited Mouth Opening  Dental  (+) Teeth Intact, Dental Advisory Given   Pulmonary Current Smoker,    breath sounds clear to auscultation       Cardiovascular negative cardio ROS   Rhythm:Regular Rate:Normal     Neuro/Psych negative neurological ROS  negative psych ROS   GI/Hepatic negative GI ROS, Neg liver ROS,   Endo/Other  negative endocrine ROS  Renal/GU negative Renal ROS  negative genitourinary   Musculoskeletal negative musculoskeletal ROS (+)   Abdominal Normal abdominal exam  (+)   Peds negative pediatric ROS (+)  Hematology negative hematology ROS (+)   Anesthesia Other Findings   Reproductive/Obstetrics negative OB ROS                            Anesthesia Physical Anesthesia Plan  ASA: I  Anesthesia Plan: General   Post-op Pain Management: GA combined w/ Regional for post-op pain   Induction: Intravenous  Airway Management Planned: LMA  Additional Equipment:   Intra-op Plan:   Post-operative Plan: Extubation in OR  Informed Consent: I have reviewed the patients History and Physical, chart, labs and discussed the procedure including the risks, benefits and alternatives for the proposed anesthesia with the patient or authorized representative who has indicated his/her understanding and acceptance.   Dental advisory given  Plan Discussed with: CRNA  Anesthesia Plan Comments:         Anesthesia Quick Evaluation

## 2016-02-10 ENCOUNTER — Encounter (HOSPITAL_BASED_OUTPATIENT_CLINIC_OR_DEPARTMENT_OTHER): Payer: Self-pay | Admitting: Orthopedic Surgery

## 2016-02-22 DIAGNOSIS — S52502D Unspecified fracture of the lower end of left radius, subsequent encounter for closed fracture with routine healing: Secondary | ICD-10-CM | POA: Diagnosis not present

## 2016-02-22 DIAGNOSIS — S52572D Other intraarticular fracture of lower end of left radius, subsequent encounter for closed fracture with routine healing: Secondary | ICD-10-CM | POA: Diagnosis not present

## 2016-03-06 DIAGNOSIS — M62542 Muscle wasting and atrophy, not elsewhere classified, left hand: Secondary | ICD-10-CM | POA: Diagnosis not present

## 2016-03-06 DIAGNOSIS — M25532 Pain in left wrist: Secondary | ICD-10-CM | POA: Diagnosis not present

## 2016-03-06 DIAGNOSIS — M25632 Stiffness of left wrist, not elsewhere classified: Secondary | ICD-10-CM | POA: Diagnosis not present

## 2016-03-06 DIAGNOSIS — M25432 Effusion, left wrist: Secondary | ICD-10-CM | POA: Diagnosis not present

## 2016-03-08 DIAGNOSIS — M62542 Muscle wasting and atrophy, not elsewhere classified, left hand: Secondary | ICD-10-CM | POA: Diagnosis not present

## 2016-03-08 DIAGNOSIS — M25432 Effusion, left wrist: Secondary | ICD-10-CM | POA: Diagnosis not present

## 2016-03-08 DIAGNOSIS — M25632 Stiffness of left wrist, not elsewhere classified: Secondary | ICD-10-CM | POA: Diagnosis not present

## 2016-03-08 DIAGNOSIS — M25532 Pain in left wrist: Secondary | ICD-10-CM | POA: Diagnosis not present

## 2016-03-13 DIAGNOSIS — M25432 Effusion, left wrist: Secondary | ICD-10-CM | POA: Diagnosis not present

## 2016-03-13 DIAGNOSIS — M25632 Stiffness of left wrist, not elsewhere classified: Secondary | ICD-10-CM | POA: Diagnosis not present

## 2016-03-13 DIAGNOSIS — M25532 Pain in left wrist: Secondary | ICD-10-CM | POA: Diagnosis not present

## 2016-03-13 DIAGNOSIS — M62542 Muscle wasting and atrophy, not elsewhere classified, left hand: Secondary | ICD-10-CM | POA: Diagnosis not present

## 2016-03-20 DIAGNOSIS — S52572D Other intraarticular fracture of lower end of left radius, subsequent encounter for closed fracture with routine healing: Secondary | ICD-10-CM | POA: Diagnosis not present

## 2016-03-21 DIAGNOSIS — M62542 Muscle wasting and atrophy, not elsewhere classified, left hand: Secondary | ICD-10-CM | POA: Diagnosis not present

## 2016-03-21 DIAGNOSIS — M25432 Effusion, left wrist: Secondary | ICD-10-CM | POA: Diagnosis not present

## 2016-03-21 DIAGNOSIS — M25532 Pain in left wrist: Secondary | ICD-10-CM | POA: Diagnosis not present

## 2016-03-21 DIAGNOSIS — M25632 Stiffness of left wrist, not elsewhere classified: Secondary | ICD-10-CM | POA: Diagnosis not present

## 2016-03-23 DIAGNOSIS — M62542 Muscle wasting and atrophy, not elsewhere classified, left hand: Secondary | ICD-10-CM | POA: Diagnosis not present

## 2016-03-23 DIAGNOSIS — M25432 Effusion, left wrist: Secondary | ICD-10-CM | POA: Diagnosis not present

## 2016-03-23 DIAGNOSIS — M25532 Pain in left wrist: Secondary | ICD-10-CM | POA: Diagnosis not present

## 2016-03-23 DIAGNOSIS — M25632 Stiffness of left wrist, not elsewhere classified: Secondary | ICD-10-CM | POA: Diagnosis not present

## 2016-03-28 DIAGNOSIS — M25532 Pain in left wrist: Secondary | ICD-10-CM | POA: Diagnosis not present

## 2016-03-28 DIAGNOSIS — M25632 Stiffness of left wrist, not elsewhere classified: Secondary | ICD-10-CM | POA: Diagnosis not present

## 2016-03-28 DIAGNOSIS — M62542 Muscle wasting and atrophy, not elsewhere classified, left hand: Secondary | ICD-10-CM | POA: Diagnosis not present

## 2016-03-28 DIAGNOSIS — M25432 Effusion, left wrist: Secondary | ICD-10-CM | POA: Diagnosis not present

## 2016-03-30 DIAGNOSIS — M62542 Muscle wasting and atrophy, not elsewhere classified, left hand: Secondary | ICD-10-CM | POA: Diagnosis not present

## 2016-03-30 DIAGNOSIS — M25532 Pain in left wrist: Secondary | ICD-10-CM | POA: Diagnosis not present

## 2016-03-30 DIAGNOSIS — M25432 Effusion, left wrist: Secondary | ICD-10-CM | POA: Diagnosis not present

## 2016-03-30 DIAGNOSIS — M25632 Stiffness of left wrist, not elsewhere classified: Secondary | ICD-10-CM | POA: Diagnosis not present

## 2016-04-04 DIAGNOSIS — M62542 Muscle wasting and atrophy, not elsewhere classified, left hand: Secondary | ICD-10-CM | POA: Diagnosis not present

## 2016-04-04 DIAGNOSIS — M25632 Stiffness of left wrist, not elsewhere classified: Secondary | ICD-10-CM | POA: Diagnosis not present

## 2016-04-04 DIAGNOSIS — M25432 Effusion, left wrist: Secondary | ICD-10-CM | POA: Diagnosis not present

## 2016-04-04 DIAGNOSIS — M25532 Pain in left wrist: Secondary | ICD-10-CM | POA: Diagnosis not present

## 2016-04-06 DIAGNOSIS — M25432 Effusion, left wrist: Secondary | ICD-10-CM | POA: Diagnosis not present

## 2016-04-06 DIAGNOSIS — M62542 Muscle wasting and atrophy, not elsewhere classified, left hand: Secondary | ICD-10-CM | POA: Diagnosis not present

## 2016-04-06 DIAGNOSIS — M25532 Pain in left wrist: Secondary | ICD-10-CM | POA: Diagnosis not present

## 2016-04-06 DIAGNOSIS — M25632 Stiffness of left wrist, not elsewhere classified: Secondary | ICD-10-CM | POA: Diagnosis not present

## 2016-04-11 DIAGNOSIS — M25632 Stiffness of left wrist, not elsewhere classified: Secondary | ICD-10-CM | POA: Diagnosis not present

## 2016-04-11 DIAGNOSIS — M25532 Pain in left wrist: Secondary | ICD-10-CM | POA: Diagnosis not present

## 2016-04-11 DIAGNOSIS — M62542 Muscle wasting and atrophy, not elsewhere classified, left hand: Secondary | ICD-10-CM | POA: Diagnosis not present

## 2016-04-11 DIAGNOSIS — M25432 Effusion, left wrist: Secondary | ICD-10-CM | POA: Diagnosis not present

## 2016-04-18 DIAGNOSIS — M25632 Stiffness of left wrist, not elsewhere classified: Secondary | ICD-10-CM | POA: Diagnosis not present

## 2016-04-18 DIAGNOSIS — M25532 Pain in left wrist: Secondary | ICD-10-CM | POA: Diagnosis not present

## 2016-04-18 DIAGNOSIS — M25542 Pain in joints of left hand: Secondary | ICD-10-CM | POA: Diagnosis not present

## 2016-04-18 DIAGNOSIS — M25432 Effusion, left wrist: Secondary | ICD-10-CM | POA: Diagnosis not present

## 2016-04-21 DIAGNOSIS — M25432 Effusion, left wrist: Secondary | ICD-10-CM | POA: Diagnosis not present

## 2016-04-21 DIAGNOSIS — M25632 Stiffness of left wrist, not elsewhere classified: Secondary | ICD-10-CM | POA: Diagnosis not present

## 2016-04-21 DIAGNOSIS — M25542 Pain in joints of left hand: Secondary | ICD-10-CM | POA: Diagnosis not present

## 2016-04-21 DIAGNOSIS — M25532 Pain in left wrist: Secondary | ICD-10-CM | POA: Diagnosis not present

## 2016-04-24 DIAGNOSIS — M25432 Effusion, left wrist: Secondary | ICD-10-CM | POA: Diagnosis not present

## 2016-04-24 DIAGNOSIS — M25632 Stiffness of left wrist, not elsewhere classified: Secondary | ICD-10-CM | POA: Diagnosis not present

## 2016-04-24 DIAGNOSIS — S52572D Other intraarticular fracture of lower end of left radius, subsequent encounter for closed fracture with routine healing: Secondary | ICD-10-CM | POA: Diagnosis not present

## 2016-04-24 DIAGNOSIS — M25542 Pain in joints of left hand: Secondary | ICD-10-CM | POA: Diagnosis not present

## 2016-04-24 DIAGNOSIS — M25532 Pain in left wrist: Secondary | ICD-10-CM | POA: Diagnosis not present

## 2016-04-26 DIAGNOSIS — M25542 Pain in joints of left hand: Secondary | ICD-10-CM | POA: Diagnosis not present

## 2016-04-26 DIAGNOSIS — M25432 Effusion, left wrist: Secondary | ICD-10-CM | POA: Diagnosis not present

## 2016-04-26 DIAGNOSIS — M25632 Stiffness of left wrist, not elsewhere classified: Secondary | ICD-10-CM | POA: Diagnosis not present

## 2016-04-26 DIAGNOSIS — M25532 Pain in left wrist: Secondary | ICD-10-CM | POA: Diagnosis not present

## 2016-05-02 DIAGNOSIS — M25432 Effusion, left wrist: Secondary | ICD-10-CM | POA: Diagnosis not present

## 2016-05-02 DIAGNOSIS — M25632 Stiffness of left wrist, not elsewhere classified: Secondary | ICD-10-CM | POA: Diagnosis not present

## 2016-05-02 DIAGNOSIS — M25542 Pain in joints of left hand: Secondary | ICD-10-CM | POA: Diagnosis not present

## 2016-05-02 DIAGNOSIS — M25532 Pain in left wrist: Secondary | ICD-10-CM | POA: Diagnosis not present

## 2016-05-04 DIAGNOSIS — M25432 Effusion, left wrist: Secondary | ICD-10-CM | POA: Diagnosis not present

## 2016-05-04 DIAGNOSIS — M25532 Pain in left wrist: Secondary | ICD-10-CM | POA: Diagnosis not present

## 2016-05-04 DIAGNOSIS — M25632 Stiffness of left wrist, not elsewhere classified: Secondary | ICD-10-CM | POA: Diagnosis not present

## 2016-05-04 DIAGNOSIS — M25542 Pain in joints of left hand: Secondary | ICD-10-CM | POA: Diagnosis not present

## 2016-05-08 DIAGNOSIS — M25632 Stiffness of left wrist, not elsewhere classified: Secondary | ICD-10-CM | POA: Diagnosis not present

## 2016-05-08 DIAGNOSIS — M25432 Effusion, left wrist: Secondary | ICD-10-CM | POA: Diagnosis not present

## 2016-05-08 DIAGNOSIS — M25532 Pain in left wrist: Secondary | ICD-10-CM | POA: Diagnosis not present

## 2016-05-08 DIAGNOSIS — M25542 Pain in joints of left hand: Secondary | ICD-10-CM | POA: Diagnosis not present

## 2016-05-10 DIAGNOSIS — M25432 Effusion, left wrist: Secondary | ICD-10-CM | POA: Diagnosis not present

## 2016-05-10 DIAGNOSIS — M25542 Pain in joints of left hand: Secondary | ICD-10-CM | POA: Diagnosis not present

## 2016-05-10 DIAGNOSIS — M25532 Pain in left wrist: Secondary | ICD-10-CM | POA: Diagnosis not present

## 2016-05-10 DIAGNOSIS — M25632 Stiffness of left wrist, not elsewhere classified: Secondary | ICD-10-CM | POA: Diagnosis not present

## 2016-05-17 DIAGNOSIS — M25632 Stiffness of left wrist, not elsewhere classified: Secondary | ICD-10-CM | POA: Diagnosis not present

## 2016-05-17 DIAGNOSIS — M25532 Pain in left wrist: Secondary | ICD-10-CM | POA: Diagnosis not present

## 2016-05-17 DIAGNOSIS — M25542 Pain in joints of left hand: Secondary | ICD-10-CM | POA: Diagnosis not present

## 2016-05-17 DIAGNOSIS — M25432 Effusion, left wrist: Secondary | ICD-10-CM | POA: Diagnosis not present

## 2016-05-18 DIAGNOSIS — M25532 Pain in left wrist: Secondary | ICD-10-CM | POA: Diagnosis not present

## 2016-05-18 DIAGNOSIS — M25632 Stiffness of left wrist, not elsewhere classified: Secondary | ICD-10-CM | POA: Diagnosis not present

## 2016-05-18 DIAGNOSIS — M25432 Effusion, left wrist: Secondary | ICD-10-CM | POA: Diagnosis not present

## 2016-05-18 DIAGNOSIS — M25542 Pain in joints of left hand: Secondary | ICD-10-CM | POA: Diagnosis not present

## 2016-05-23 DIAGNOSIS — M25632 Stiffness of left wrist, not elsewhere classified: Secondary | ICD-10-CM | POA: Diagnosis not present

## 2016-05-23 DIAGNOSIS — M25532 Pain in left wrist: Secondary | ICD-10-CM | POA: Diagnosis not present

## 2016-05-23 DIAGNOSIS — M25432 Effusion, left wrist: Secondary | ICD-10-CM | POA: Diagnosis not present

## 2016-05-23 DIAGNOSIS — M25542 Pain in joints of left hand: Secondary | ICD-10-CM | POA: Diagnosis not present

## 2016-05-25 DIAGNOSIS — M25542 Pain in joints of left hand: Secondary | ICD-10-CM | POA: Diagnosis not present

## 2016-05-25 DIAGNOSIS — M25532 Pain in left wrist: Secondary | ICD-10-CM | POA: Diagnosis not present

## 2016-05-25 DIAGNOSIS — M25432 Effusion, left wrist: Secondary | ICD-10-CM | POA: Diagnosis not present

## 2016-05-25 DIAGNOSIS — M25632 Stiffness of left wrist, not elsewhere classified: Secondary | ICD-10-CM | POA: Diagnosis not present

## 2016-05-30 DIAGNOSIS — M25432 Effusion, left wrist: Secondary | ICD-10-CM | POA: Diagnosis not present

## 2016-05-30 DIAGNOSIS — M25632 Stiffness of left wrist, not elsewhere classified: Secondary | ICD-10-CM | POA: Diagnosis not present

## 2016-05-30 DIAGNOSIS — M25542 Pain in joints of left hand: Secondary | ICD-10-CM | POA: Diagnosis not present

## 2016-05-30 DIAGNOSIS — M25532 Pain in left wrist: Secondary | ICD-10-CM | POA: Diagnosis not present

## 2016-06-01 DIAGNOSIS — M25532 Pain in left wrist: Secondary | ICD-10-CM | POA: Diagnosis not present

## 2016-06-01 DIAGNOSIS — M25632 Stiffness of left wrist, not elsewhere classified: Secondary | ICD-10-CM | POA: Diagnosis not present

## 2016-06-01 DIAGNOSIS — M25432 Effusion, left wrist: Secondary | ICD-10-CM | POA: Diagnosis not present

## 2016-06-01 DIAGNOSIS — M25542 Pain in joints of left hand: Secondary | ICD-10-CM | POA: Diagnosis not present

## 2016-06-06 DIAGNOSIS — M25542 Pain in joints of left hand: Secondary | ICD-10-CM | POA: Diagnosis not present

## 2016-06-06 DIAGNOSIS — M25532 Pain in left wrist: Secondary | ICD-10-CM | POA: Diagnosis not present

## 2016-06-06 DIAGNOSIS — M25432 Effusion, left wrist: Secondary | ICD-10-CM | POA: Diagnosis not present

## 2016-06-06 DIAGNOSIS — M25632 Stiffness of left wrist, not elsewhere classified: Secondary | ICD-10-CM | POA: Diagnosis not present

## 2016-06-12 DIAGNOSIS — S52572D Other intraarticular fracture of lower end of left radius, subsequent encounter for closed fracture with routine healing: Secondary | ICD-10-CM | POA: Diagnosis not present

## 2016-06-12 DIAGNOSIS — M25512 Pain in left shoulder: Secondary | ICD-10-CM | POA: Diagnosis not present

## 2017-02-01 DIAGNOSIS — L821 Other seborrheic keratosis: Secondary | ICD-10-CM | POA: Diagnosis not present

## 2017-02-01 DIAGNOSIS — L728 Other follicular cysts of the skin and subcutaneous tissue: Secondary | ICD-10-CM | POA: Diagnosis not present

## 2017-02-01 DIAGNOSIS — L280 Lichen simplex chronicus: Secondary | ICD-10-CM | POA: Diagnosis not present

## 2017-11-19 ENCOUNTER — Inpatient Hospital Stay (HOSPITAL_COMMUNITY)
Admission: EM | Admit: 2017-11-19 | Discharge: 2017-11-21 | DRG: 564 | Disposition: A | Discharge status: Home Health | Payer: Medicare Other | Attending: General Surgery | Admitting: General Surgery

## 2017-11-19 ENCOUNTER — Encounter (HOSPITAL_COMMUNITY): Payer: Self-pay | Admitting: Emergency Medicine

## 2017-11-19 ENCOUNTER — Emergency Department (HOSPITAL_COMMUNITY): Payer: Medicare Other

## 2017-11-19 ENCOUNTER — Other Ambulatory Visit: Payer: Self-pay

## 2017-11-19 DIAGNOSIS — M8448XA Pathological fracture, other site, initial encounter for fracture: Secondary | ICD-10-CM | POA: Diagnosis not present

## 2017-11-19 DIAGNOSIS — Z791 Long term (current) use of non-steroidal anti-inflammatories (NSAID): Secondary | ICD-10-CM | POA: Diagnosis not present

## 2017-11-19 DIAGNOSIS — S22000A Wedge compression fracture of unspecified thoracic vertebra, initial encounter for closed fracture: Secondary | ICD-10-CM

## 2017-11-19 DIAGNOSIS — S22050A Wedge compression fracture of T5-T6 vertebra, initial encounter for closed fracture: Secondary | ICD-10-CM | POA: Diagnosis not present

## 2017-11-19 DIAGNOSIS — Z79899 Other long term (current) drug therapy: Secondary | ICD-10-CM

## 2017-11-19 DIAGNOSIS — J439 Emphysema, unspecified: Secondary | ICD-10-CM | POA: Diagnosis present

## 2017-11-19 DIAGNOSIS — F17210 Nicotine dependence, cigarettes, uncomplicated: Secondary | ICD-10-CM | POA: Diagnosis present

## 2017-11-19 DIAGNOSIS — S36113A Laceration of liver, unspecified degree, initial encounter: Secondary | ICD-10-CM | POA: Diagnosis present

## 2017-11-19 DIAGNOSIS — S2222XA Fracture of body of sternum, initial encounter for closed fracture: Secondary | ICD-10-CM

## 2017-11-19 DIAGNOSIS — R079 Chest pain, unspecified: Secondary | ICD-10-CM

## 2017-11-19 DIAGNOSIS — M8588 Other specified disorders of bone density and structure, other site: Secondary | ICD-10-CM | POA: Diagnosis present

## 2017-11-19 DIAGNOSIS — S36115A Moderate laceration of liver, initial encounter: Secondary | ICD-10-CM | POA: Diagnosis present

## 2017-11-19 DIAGNOSIS — Y92410 Unspecified street and highway as the place of occurrence of the external cause: Secondary | ICD-10-CM

## 2017-11-19 DIAGNOSIS — R7989 Other specified abnormal findings of blood chemistry: Secondary | ICD-10-CM | POA: Diagnosis present

## 2017-11-19 DIAGNOSIS — R748 Abnormal levels of other serum enzymes: Secondary | ICD-10-CM | POA: Diagnosis not present

## 2017-11-19 DIAGNOSIS — K4090 Unilateral inguinal hernia, without obstruction or gangrene, not specified as recurrent: Secondary | ICD-10-CM | POA: Diagnosis present

## 2017-11-19 DIAGNOSIS — S22030A Wedge compression fracture of third thoracic vertebra, initial encounter for closed fracture: Secondary | ICD-10-CM | POA: Diagnosis not present

## 2017-11-19 DIAGNOSIS — S3991XA Unspecified injury of abdomen, initial encounter: Secondary | ICD-10-CM | POA: Diagnosis not present

## 2017-11-19 DIAGNOSIS — S0990XA Unspecified injury of head, initial encounter: Secondary | ICD-10-CM | POA: Diagnosis not present

## 2017-11-19 DIAGNOSIS — S2220XA Unspecified fracture of sternum, initial encounter for closed fracture: Secondary | ICD-10-CM | POA: Diagnosis not present

## 2017-11-19 DIAGNOSIS — S299XXA Unspecified injury of thorax, initial encounter: Secondary | ICD-10-CM | POA: Diagnosis not present

## 2017-11-19 DIAGNOSIS — S22020A Wedge compression fracture of second thoracic vertebra, initial encounter for closed fracture: Secondary | ICD-10-CM | POA: Diagnosis not present

## 2017-11-19 DIAGNOSIS — S60221A Contusion of right hand, initial encounter: Secondary | ICD-10-CM | POA: Diagnosis present

## 2017-11-19 DIAGNOSIS — Z882 Allergy status to sulfonamides status: Secondary | ICD-10-CM | POA: Diagnosis not present

## 2017-11-19 DIAGNOSIS — Z7982 Long term (current) use of aspirin: Secondary | ICD-10-CM

## 2017-11-19 DIAGNOSIS — S199XXA Unspecified injury of neck, initial encounter: Secondary | ICD-10-CM | POA: Diagnosis not present

## 2017-11-19 HISTORY — DX: Pneumonia, unspecified organism: J18.9

## 2017-11-19 HISTORY — DX: Chronic obstructive pulmonary disease, unspecified: J44.9

## 2017-11-19 LAB — COMPREHENSIVE METABOLIC PANEL
ALT: 276 U/L — ABNORMAL HIGH (ref 0–44)
ANION GAP: 11 (ref 5–15)
AST: 457 U/L — ABNORMAL HIGH (ref 15–41)
Albumin: 4.6 g/dL (ref 3.5–5.0)
Alkaline Phosphatase: 133 U/L — ABNORMAL HIGH (ref 38–126)
BILIRUBIN TOTAL: 1 mg/dL (ref 0.3–1.2)
BUN: 19 mg/dL (ref 8–23)
CO2: 25 mmol/L (ref 22–32)
Calcium: 9.5 mg/dL (ref 8.9–10.3)
Chloride: 104 mmol/L (ref 98–111)
Creatinine, Ser: 0.6 mg/dL (ref 0.44–1.00)
Glucose, Bld: 113 mg/dL — ABNORMAL HIGH (ref 70–99)
POTASSIUM: 4.4 mmol/L (ref 3.5–5.1)
Sodium: 140 mmol/L (ref 135–145)
TOTAL PROTEIN: 7.6 g/dL (ref 6.5–8.1)

## 2017-11-19 LAB — CBC
HEMATOCRIT: 44.8 % (ref 36.0–46.0)
Hemoglobin: 14.8 g/dL (ref 12.0–15.0)
MCH: 30.1 pg (ref 26.0–34.0)
MCHC: 33 g/dL (ref 30.0–36.0)
MCV: 91.2 fL (ref 78.0–100.0)
Platelets: 243 10*3/uL (ref 150–400)
RBC: 4.91 MIL/uL (ref 3.87–5.11)
RDW: 13.8 % (ref 11.5–15.5)
WBC: 15.7 10*3/uL — ABNORMAL HIGH (ref 4.0–10.5)

## 2017-11-19 LAB — TROPONIN I

## 2017-11-19 MED ORDER — MORPHINE SULFATE (PF) 2 MG/ML IV SOLN
2.0000 mg | Freq: Once | INTRAVENOUS | Status: AC
  Administered 2017-11-19: 2 mg via INTRAVENOUS
  Filled 2017-11-19: qty 1

## 2017-11-19 MED ORDER — IOPAMIDOL (ISOVUE-300) INJECTION 61%
100.0000 mL | Freq: Once | INTRAVENOUS | Status: AC | PRN
  Administered 2017-11-19: 100 mL via INTRAVENOUS
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
## 2017-11-20 ENCOUNTER — Encounter (HOSPITAL_COMMUNITY): Payer: Self-pay | Admitting: General Surgery

## 2017-11-20 DIAGNOSIS — K4090 Unilateral inguinal hernia, without obstruction or gangrene, not specified as recurrent: Secondary | ICD-10-CM | POA: Diagnosis present

## 2017-11-20 DIAGNOSIS — S60221A Contusion of right hand, initial encounter: Secondary | ICD-10-CM | POA: Diagnosis present

## 2017-11-20 DIAGNOSIS — Z791 Long term (current) use of non-steroidal anti-inflammatories (NSAID): Secondary | ICD-10-CM | POA: Diagnosis not present

## 2017-11-20 DIAGNOSIS — Y92410 Unspecified street and highway as the place of occurrence of the external cause: Secondary | ICD-10-CM | POA: Diagnosis not present

## 2017-11-20 DIAGNOSIS — R748 Abnormal levels of other serum enzymes: Secondary | ICD-10-CM | POA: Diagnosis present

## 2017-11-20 DIAGNOSIS — S36115A Moderate laceration of liver, initial encounter: Secondary | ICD-10-CM | POA: Diagnosis present

## 2017-11-20 DIAGNOSIS — M8448XA Pathological fracture, other site, initial encounter for fracture: Secondary | ICD-10-CM | POA: Diagnosis present

## 2017-11-20 DIAGNOSIS — Z882 Allergy status to sulfonamides status: Secondary | ICD-10-CM | POA: Diagnosis not present

## 2017-11-20 DIAGNOSIS — R079 Chest pain, unspecified: Secondary | ICD-10-CM | POA: Diagnosis not present

## 2017-11-20 DIAGNOSIS — Z79899 Other long term (current) drug therapy: Secondary | ICD-10-CM | POA: Diagnosis not present

## 2017-11-20 DIAGNOSIS — S2220XA Unspecified fracture of sternum, initial encounter for closed fracture: Secondary | ICD-10-CM | POA: Diagnosis present

## 2017-11-20 DIAGNOSIS — M8588 Other specified disorders of bone density and structure, other site: Secondary | ICD-10-CM | POA: Diagnosis present

## 2017-11-20 DIAGNOSIS — S36113A Laceration of liver, unspecified degree, initial encounter: Secondary | ICD-10-CM | POA: Diagnosis present

## 2017-11-20 DIAGNOSIS — S199XXA Unspecified injury of neck, initial encounter: Secondary | ICD-10-CM | POA: Diagnosis not present

## 2017-11-20 DIAGNOSIS — R7989 Other specified abnormal findings of blood chemistry: Secondary | ICD-10-CM | POA: Diagnosis present

## 2017-11-20 DIAGNOSIS — S3991XA Unspecified injury of abdomen, initial encounter: Secondary | ICD-10-CM | POA: Diagnosis not present

## 2017-11-20 DIAGNOSIS — S299XXA Unspecified injury of thorax, initial encounter: Secondary | ICD-10-CM | POA: Diagnosis not present

## 2017-11-20 DIAGNOSIS — J439 Emphysema, unspecified: Secondary | ICD-10-CM | POA: Diagnosis present

## 2017-11-20 DIAGNOSIS — Z7982 Long term (current) use of aspirin: Secondary | ICD-10-CM | POA: Diagnosis not present

## 2017-11-20 DIAGNOSIS — F17210 Nicotine dependence, cigarettes, uncomplicated: Secondary | ICD-10-CM | POA: Diagnosis present

## 2017-11-20 DIAGNOSIS — S0990XA Unspecified injury of head, initial encounter: Secondary | ICD-10-CM | POA: Diagnosis not present

## 2017-11-20 LAB — CBC
HEMATOCRIT: 43.1 % (ref 36.0–46.0)
HEMOGLOBIN: 13.8 g/dL (ref 12.0–15.0)
MCH: 29.4 pg (ref 26.0–34.0)
MCHC: 32 g/dL (ref 30.0–36.0)
MCV: 91.7 fL (ref 78.0–100.0)
Platelets: 176 10*3/uL (ref 150–400)
RBC: 4.7 MIL/uL (ref 3.87–5.11)
RDW: 13.2 % (ref 11.5–15.5)
WBC: 11.4 10*3/uL — AB (ref 4.0–10.5)

## 2017-11-20 LAB — PROTIME-INR
INR: 0.93
Prothrombin Time: 12.4 seconds (ref 11.4–15.2)

## 2017-11-20 LAB — TYPE AND SCREEN
ABO/RH(D): O POS
Antibody Screen: NEGATIVE

## 2017-11-20 LAB — MRSA PCR SCREENING: MRSA BY PCR: NEGATIVE

## 2017-11-20 LAB — ETHANOL: Alcohol, Ethyl (B): <10 mg/dL (ref <10)

## 2017-11-20 LAB — AMMONIA: Ammonia: <9 umol/L — ABNORMAL LOW (ref 9–35)

## 2017-11-20 LAB — LIPASE, BLOOD: LIPASE: 35 U/L (ref 11–51)

## 2017-11-20 MED ORDER — FENTANYL CITRATE (PF) 100 MCG/2ML IJ SOLN
50.0000 ug | INTRAMUSCULAR | Status: DC | PRN
  Administered 2017-11-20 (×3): 50 ug via INTRAVENOUS
  Filled 2017-11-20 (×4): qty 2

## 2017-11-20 MED ORDER — IBUPROFEN 800 MG PO TABS
800.0000 mg | ORAL_TABLET | Freq: Three times a day (TID) | ORAL | Status: DC
  Administered 2017-11-20 – 2017-11-21 (×4): 800 mg via ORAL
  Filled 2017-11-20: qty 1
  Filled 2017-11-20 (×2): qty 4
  Filled 2017-11-20 (×4): qty 1
  Filled 2017-11-20 (×2): qty 4

## 2017-11-20 MED ORDER — FENTANYL CITRATE (PF) 100 MCG/2ML IJ SOLN
50.0000 ug | Freq: Once | INTRAMUSCULAR | Status: AC
  Administered 2017-11-20: 50 ug via INTRAVENOUS

## 2017-11-20 MED ORDER — DOCUSATE SODIUM 100 MG PO CAPS
100.0000 mg | ORAL_CAPSULE | Freq: Two times a day (BID) | ORAL | Status: DC
  Administered 2017-11-20 – 2017-11-21 (×2): 100 mg via ORAL
  Filled 2017-11-20 (×2): qty 1

## 2017-11-20 MED ORDER — ONDANSETRON HCL 4 MG/2ML IJ SOLN: 4.0000 mg | Freq: Four times a day (QID) | INTRAMUSCULAR | Status: DC | PRN

## 2017-11-20 MED ORDER — METOPROLOL TARTRATE 5 MG/5ML IV SOLN: 5.0000 mg | Freq: Four times a day (QID) | INTRAVENOUS | Status: DC | PRN

## 2017-11-20 MED ORDER — GABAPENTIN 300 MG PO CAPS
300.0000 mg | ORAL_CAPSULE | Freq: Three times a day (TID) | ORAL | Status: DC
  Administered 2017-11-20 – 2017-11-21 (×4): 300 mg via ORAL
  Filled 2017-11-20 (×4): qty 1

## 2017-11-20 MED ORDER — SODIUM CHLORIDE 0.9 % IV SOLN
INTRAVENOUS | Status: DC
Start: 2017-11-20 — End: 2017-11-21
  Administered 2017-11-20 – 2017-11-21 (×3): via INTRAVENOUS

## 2017-11-20 MED ORDER — HYDRALAZINE HCL 20 MG/ML IJ SOLN: 10.0000 mg | INTRAMUSCULAR | Status: DC | PRN

## 2017-11-20 MED ORDER — IPRATROPIUM-ALBUTEROL 0.5-2.5 (3) MG/3ML IN SOLN: 3.0000 mL | Freq: Four times a day (QID) | RESPIRATORY_TRACT | Status: DC | PRN

## 2017-11-20 MED ORDER — BISACODYL 10 MG RE SUPP: 10.0000 mg | Freq: Every day | RECTAL | Status: DC | PRN

## 2017-11-20 MED ORDER — FENTANYL CITRATE (PF) 100 MCG/2ML IJ SOLN
INTRAMUSCULAR | Status: AC
  Filled 2017-11-20: qty 2

## 2017-11-20 MED ORDER — HYDROMORPHONE HCL 1 MG/ML IJ SOLN: 0.5000 mg | INTRAMUSCULAR | Status: DC | PRN

## 2017-11-20 MED ORDER — METHOCARBAMOL 1000 MG/10ML IJ SOLN
500.0000 mg | Freq: Four times a day (QID) | INTRAVENOUS | Status: DC | PRN
  Filled 2017-11-20: qty 5

## 2017-11-20 MED ORDER — ONDANSETRON 4 MG PO TBDP: 4.0000 mg | ORAL_TABLET | Freq: Four times a day (QID) | ORAL | Status: DC | PRN

## 2017-11-20 MED ORDER — FENTANYL CITRATE (PF) 100 MCG/2ML IJ SOLN
50.0000 ug | Freq: Once | INTRAMUSCULAR | Status: AC
  Administered 2017-11-20: 50 ug via INTRAVENOUS
  Filled 2017-11-20: qty 2

## 2017-11-20 MED ORDER — OXYCODONE HCL 5 MG PO TABS
5.0000 mg | ORAL_TABLET | ORAL | Status: DC | PRN
Start: 2017-11-20 — End: 2017-11-21
  Administered 2017-11-20 – 2017-11-21 (×3): 5 mg via ORAL
  Filled 2017-11-20 (×3): qty 1
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
## 2017-11-20 NOTE — ED Notes (Signed)
Pt encouraged to cough, turn and deep breathe at least every 2 hours.

## 2017-11-20 NOTE — Progress Notes (Signed)
Pt is on the unit  [facility]  Stated number given to me by secretary was incorrect  Admission complete

## 2017-11-20 NOTE — ED Notes (Signed)
Pt instructed and demonstrated turn cough and deep breathing.

## 2017-11-20 NOTE — Progress Notes (Signed)
Tried calling for report with the number given by secretary [phone].  Kept getting a busy signal.
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
## 2017-11-21 LAB — COMPREHENSIVE METABOLIC PANEL
ALT: 119 U/L — AB (ref 0–44)
ANION GAP: 7 (ref 5–15)
AST: 84 U/L — ABNORMAL HIGH (ref 15–41)
Albumin: 3.3 g/dL — ABNORMAL LOW (ref 3.5–5.0)
Alkaline Phosphatase: 90 U/L (ref 38–126)
BILIRUBIN TOTAL: 1.5 mg/dL — AB (ref 0.3–1.2)
BUN: 16 mg/dL (ref 8–23)
CALCIUM: 8.5 mg/dL — AB (ref 8.9–10.3)
CO2: 24 mmol/L (ref 22–32)
CREATININE: 0.71 mg/dL (ref 0.44–1.00)
Chloride: 107 mmol/L (ref 98–111)
GFR calc non Af Amer: >60 mL/min (ref >60)
GLUCOSE: 190 mg/dL — AB (ref 70–99)
Potassium: 3.7 mmol/L (ref 3.5–5.1)
Sodium: 138 mmol/L (ref 135–145)
TOTAL PROTEIN: 5.8 g/dL — AB (ref 6.5–8.1)

## 2017-11-21 LAB — HEMOGLOBIN AND HEMATOCRIT, BLOOD
HCT: 35.7 % — ABNORMAL LOW (ref 36.0–46.0)
Hemoglobin: 11.5 g/dL — ABNORMAL LOW (ref 12.0–15.0)

## 2017-11-21 MED ORDER — OXYCODONE HCL 5 MG PO TABS: 5.0000 mg | ORAL_TABLET | ORAL | 0 refills | Status: AC | PRN

## 2017-11-21 NOTE — Progress Notes (Signed)
SATURATION QUALIFICATIONS: (This note is used to comply with regulatory documentation for home oxygen)  Patient Saturations on Room Air at Rest = 86%  Patient Saturations on Room Air while Ambulating = 84%  Patient Saturations on 1 Liters of oxygen while Ambulating = 90%  Please briefly explain why patient needs home oxygen: Hypoxic on RA with ambulation  Brunsvilleyndi Wynn, [place] CarolinaPT [phone] 11/21/2017

## 2017-11-21 NOTE — Progress Notes (Signed)
   11/21/17 1000  Clinical Encounter Type  Visited With Patient and family together  Visit Type Follow-up  Referral From Nurse  Consult/Referral To Chaplain  Spiritual Encounters  Spiritual Needs Prayer;Emotional  Chaplain visited with the PT and husband.  The family requested prayer and welcomed the Chaplain visit.  The PT in my assessment is in a place of needing Law and not Delorise ShinerGrace.  A more strict regimen of care for existing conditions would help the PT.  Married 52 years , 1 child make up the family unit.

## 2017-11-21 NOTE — Discharge Instructions (Signed)
Sternal Fracture °A sternal fracture is a break in the bone in the center of your chest (sternum or breastbone). This fracture is not dangerous unless there is also an injury to your heart or lungs, which are protected by the sternum and ribs. °What are the causes? °This condition is usually caused by a forceful injury from: °· Motor vehicle collisions. This is the most common cause. °· Contact sports. °· Physical assaults. ° °You can also have a sternal fracture without having a forceful injury if the bone becomes weakened over time (stress fracture or insufficiency fracture). °What increases the risk? °You may be at greater risk for a sternal fracture if you: °· Participate in direct contact sports, such as football or wrestling. °· Work at elevated heights, such as in construction. ° °Other risk factors for a stress or insufficiency sternal fracture include: °· Being female. °· Being a postmenopausal woman. °· Being age 50 or older. °· Having osteoporosis. °· Having severe curvature of the spine. °· Being on long-term steroid treatment. ° °What are the signs or symptoms? °Symptoms of this condition include: °· Pain over the sternum. °· Pain when pressing on the sternum. °· Pain that gets worse with deep breathing or coughing. °· Shortness of breath. °· Bruising. °· Swelling. °· A crackling sound when taking a deep breath or pressing on the sternum. ° °How is this diagnosed? °This condition is diagnosed with a medical history and physical exam. You may also have imaging tests, including: °· CT scan. °· Ultrasound. °· Chest X-rays that are taken from a side view. ° °Your health care provider may check your blood oxygen level with a pulse oximetry test. You may also have repeated electrocardiograms (ECGs) to make sure that your heart has not been injured. You may also have a blood test to check for damage to your heart muscle. °How is this treated? °Treatment depends on the severity of your injury. A sternal  fracture without any other injury (isolated sternal fracture) usually heals without treatment. You may need to limit (restrict) some activities at home and take medicine for pain relief. °In rare cases, you may need surgery to repair a sternal fracture that continues to cause severe pain or a sternal fracture that involves bones that have been moved out of position considerably (displaced fracture). °Follow these instructions at home: °· Take over-the-counter and prescription medicines only as told by your health care provider. °· Rest at home. Return to your normal activities as told by your health care provider. Ask your health care provider what activities are safe for you. °· If directed, apply ice to the injured area: °? Put ice in a plastic bag. °? Place a towel between your skin and the bag. °? Leave the ice on for 20 minutes, 2-3 times a day. °· Do not lift anything that is heavier than 10 lb (4.5 kg) until your health care provider says it is safe. °· Do not drive or operate heavy machinery while taking prescription pain medicine. °· Do not use any tobacco products, such as cigarettes, chewing tobacco, and e-cigarettes. If you need help quitting, ask your health care provider. °· Keep all follow-up visits as told by your health care provider. This is important. °Contact a health care provider if: °· Your pain medicine is not helping. °· You continue to have pain after several weeks. °· You develop a fever. °· You develop a cough and you have thick or bloody mucus (sputum). °Get help right away if: °·   You have difficulty breathing.  You have chest pain.  You have an abnormal heartbeat (palpitations).  You feel nauseous or you have pain in your abdomen. This information is not intended to replace advice given to you by your health care provider. Make sure you discuss any questions you have with your health care provider. Document Released: 12/21/2003 Document Revised: 01/04/2016 Document Reviewed:  12/02/2014 Elsevier Interactive Patient Education  2018 [employer].   Liver Laceration A liver laceration is a tear or a cut in the liver. The liver is an organ that is involved in many important bodily functions. Sometimes, a liver laceration can be a very serious injury. It can cause a lot of bleeding, and surgery may be needed. Other times, a liver laceration may be minor, and bed rest may be all that is needed. Either way, treatment in a hospital is almost always required. Liver lacerations are categorized in grades from 1 to 5. Low numbers identify lacerations that are less severe than lacerations with high numbers.  Grade 1: This is a tear in the outer lining of the liver. It is less than  inch (1 cm) deep.  Grade 2: This is a tear that is about  inch to 1 inch (1 to 3 cm) deep. It is less than 4 inches (10 cm) long.  Grade 3: This is a tear that is slightly more than 1 inch (3 cm) deep.  Grades 4 and 5: These lacerations are very deep. They affect a large part of the liver.  What are the causes? This condition may be caused by:  A forceful hit to the area around the liver (blunt trauma), such as in a car crash. Blunt trauma can tear the liver even though it does not break the skin.  An injury in which an object goes through the skin and into the liver (penetrating injury), such as a stab or gunshot wound.  What are the signs or symptoms? Common symptoms of this condition include:  A swollen and firm abdomen.  Pain in the abdomen.  Tenderness when pressing on the right side of the abdomen.  Other symptoms include:  Bleeding from a penetrating wound.  Bruises on the abdomen.  A fast heartbeat.  Taking quick breaths.  Feeling weak and dizzy.  How is this diagnosed? To diagnose this condition, your health care provider will do a physical exam and ask about any injuries to the right side of your abdomen. You may have various tests, such as:  Blood tests. Your  blood may be tested every few hours. This will show whether you are losing blood.  CT scan. This test is done to check for laceration or bleeding.  Laparoscopy. This involves placing a small camera into the abdomen and looking directly at the surface of the liver.  How is this treated? Treatment depends on how deep the laceration is and how much bleeding you have. Treatment options include:  Monitoring and bed rest at the hospital. You will have tests often.  Receiving donated blood through an IV tube (transfusion) to replace blood that you have lost. You may need several transfusions.  Surgery to pack gauze pads or special material around the laceration to help it heal or to repair the laceration.  Follow these instructions at home:  Take over-the-counter and prescription medicines only as told by your health care provider. Do not take any other medicines unless you ask your health care provider about them first.  Do not drive or  use heavy machinery while taking prescription pain medicines.  Rest and limit your activity as told by your health care provider. It may be several months before you can return to your usual routine. Do not participate in activities that involve physical contact or require extra energy until your health care provider approves.  Keep all follow-up visits as told by your health care provider. This is important. Contact a health care provider if:  Your abdominal pain does not go away.  You feel more weak and tired than usual. Get help right away if:  Your abdominal pain gets worse.  You have a cut on your skin that: ? Has more redness, swelling, or pain around it. ? Has more fluid or blood coming from it. ? Feels warm to the touch. ? Has pus or a bad smell coming from it.  You feel dizzy or very weak.  You have trouble breathing.  You have a fever. This information is not intended to replace advice given to you by your health care provider. Make  sure you discuss any questions you have with your health care provider. Document Released: 06/10/2010 Document Revised: 12/24/2015 Document Reviewed: 12/24/2015 Elsevier Interactive Patient Education  [employer]2018 Elsevier Inc.
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
## 2017-11-21 NOTE — Social Work (Signed)
CSW met with patient along with spouse and completed the SBIRT screening.  No risk factors identified. No intervention provided at this time.  Patricia Pencil, LCSW Clinical Social Worker [phone]   
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
## 2017-12-08 DIAGNOSIS — R0782 Intercostal pain: Secondary | ICD-10-CM | POA: Diagnosis not present

## 2017-12-08 DIAGNOSIS — Z6823 Body mass index (BMI) 23.0-23.9, adult: Secondary | ICD-10-CM | POA: Diagnosis not present

## 2017-12-08 DIAGNOSIS — S2241XG Multiple fractures of ribs, right side, subsequent encounter for fracture with delayed healing: Secondary | ICD-10-CM | POA: Diagnosis not present

## 2017-12-27 DIAGNOSIS — Z681 Body mass index (BMI) 19 or less, adult: Secondary | ICD-10-CM | POA: Diagnosis not present

## 2017-12-27 DIAGNOSIS — R634 Abnormal weight loss: Secondary | ICD-10-CM | POA: Diagnosis not present

## 2017-12-28 DIAGNOSIS — H5203 Hypermetropia, bilateral: Secondary | ICD-10-CM | POA: Diagnosis not present

## 2017-12-28 DIAGNOSIS — H52223 Regular astigmatism, bilateral: Secondary | ICD-10-CM | POA: Diagnosis not present

## 2017-12-28 DIAGNOSIS — H25811 Combined forms of age-related cataract, right eye: Secondary | ICD-10-CM | POA: Diagnosis not present

## 2017-12-28 DIAGNOSIS — H524 Presbyopia: Secondary | ICD-10-CM | POA: Diagnosis not present

## 2018-01-11 DIAGNOSIS — Z0000 Encounter for general adult medical examination without abnormal findings: Secondary | ICD-10-CM | POA: Diagnosis not present

## 2018-01-11 DIAGNOSIS — S2241XG Multiple fractures of ribs, right side, subsequent encounter for fracture with delayed healing: Secondary | ICD-10-CM | POA: Diagnosis not present

## 2018-01-11 DIAGNOSIS — R7301 Impaired fasting glucose: Secondary | ICD-10-CM | POA: Diagnosis not present

## 2018-01-11 DIAGNOSIS — R0782 Intercostal pain: Secondary | ICD-10-CM | POA: Diagnosis not present

## 2018-01-11 DIAGNOSIS — Z6823 Body mass index (BMI) 23.0-23.9, adult: Secondary | ICD-10-CM | POA: Diagnosis not present

## 2018-01-11 DIAGNOSIS — R634 Abnormal weight loss: Secondary | ICD-10-CM | POA: Diagnosis not present

## 2018-01-17 DIAGNOSIS — D530 Protein deficiency anemia: Secondary | ICD-10-CM | POA: Diagnosis not present

## 2018-01-17 DIAGNOSIS — Z681 Body mass index (BMI) 19 or less, adult: Secondary | ICD-10-CM | POA: Diagnosis not present

## 2018-01-17 DIAGNOSIS — Z712 Person consulting for explanation of examination or test findings: Secondary | ICD-10-CM | POA: Diagnosis not present

## 2018-01-17 DIAGNOSIS — R0789 Other chest pain: Secondary | ICD-10-CM | POA: Diagnosis not present

## 2018-02-01 DIAGNOSIS — R079 Chest pain, unspecified: Secondary | ICD-10-CM | POA: Diagnosis not present

## 2018-02-01 DIAGNOSIS — R0782 Intercostal pain: Secondary | ICD-10-CM | POA: Diagnosis not present

## 2018-02-01 DIAGNOSIS — S2220XD Unspecified fracture of sternum, subsequent encounter for fracture with routine healing: Secondary | ICD-10-CM | POA: Diagnosis not present

## 2018-02-01 DIAGNOSIS — Z681 Body mass index (BMI) 19 or less, adult: Secondary | ICD-10-CM | POA: Diagnosis not present

## 2018-02-02 ENCOUNTER — Ambulatory Visit (HOSPITAL_COMMUNITY)
Admission: RE | Admit: 2018-02-02 | Discharge: 2018-02-02 | Disposition: A | Discharge status: Home / Self Care | Payer: Medicare Other | Source: Ambulatory Visit | Attending: Internal Medicine | Admitting: Internal Medicine

## 2018-02-02 ENCOUNTER — Other Ambulatory Visit (HOSPITAL_COMMUNITY): Payer: Self-pay | Admitting: Internal Medicine

## 2018-02-02 DIAGNOSIS — R079 Chest pain, unspecified: Secondary | ICD-10-CM | POA: Diagnosis not present

## 2018-02-02 DIAGNOSIS — M25512 Pain in left shoulder: Secondary | ICD-10-CM | POA: Diagnosis not present

## 2018-02-02 DIAGNOSIS — W19XXXA Unspecified fall, initial encounter: Secondary | ICD-10-CM

## 2018-02-02 DIAGNOSIS — S4992XA Unspecified injury of left shoulder and upper arm, initial encounter: Secondary | ICD-10-CM | POA: Diagnosis not present

## 2018-04-04 DIAGNOSIS — M7651 Patellar tendinitis, right knee: Secondary | ICD-10-CM | POA: Diagnosis not present
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
## 2019-01-21 DIAGNOSIS — R7301 Impaired fasting glucose: Secondary | ICD-10-CM | POA: Diagnosis not present

## 2019-01-21 DIAGNOSIS — Z0000 Encounter for general adult medical examination without abnormal findings: Secondary | ICD-10-CM | POA: Diagnosis not present

## 2019-01-21 DIAGNOSIS — Z6823 Body mass index (BMI) 23.0-23.9, adult: Secondary | ICD-10-CM | POA: Diagnosis not present

## 2019-01-21 DIAGNOSIS — R0782 Intercostal pain: Secondary | ICD-10-CM | POA: Diagnosis not present

## 2019-01-21 DIAGNOSIS — S2241XG Multiple fractures of ribs, right side, subsequent encounter for fracture with delayed healing: Secondary | ICD-10-CM | POA: Diagnosis not present

## 2019-01-21 DIAGNOSIS — R634 Abnormal weight loss: Secondary | ICD-10-CM | POA: Diagnosis not present

## 2019-01-23 DIAGNOSIS — D530 Protein deficiency anemia: Secondary | ICD-10-CM | POA: Diagnosis not present

## 2019-01-23 DIAGNOSIS — R7301 Impaired fasting glucose: Secondary | ICD-10-CM | POA: Diagnosis not present

## 2020-01-09 DIAGNOSIS — Z0001 Encounter for general adult medical examination with abnormal findings: Secondary | ICD-10-CM | POA: Diagnosis not present

## 2020-01-09 DIAGNOSIS — R232 Flushing: Secondary | ICD-10-CM | POA: Diagnosis not present

## 2020-01-09 DIAGNOSIS — R7301 Impaired fasting glucose: Secondary | ICD-10-CM | POA: Diagnosis not present

## 2020-01-09 DIAGNOSIS — R7303 Prediabetes: Secondary | ICD-10-CM | POA: Diagnosis not present

## 2020-01-09 DIAGNOSIS — Z681 Body mass index (BMI) 19 or less, adult: Secondary | ICD-10-CM | POA: Diagnosis not present
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
## 2020-10-05 ENCOUNTER — Encounter: Payer: Self-pay | Admitting: Emergency Medicine

## 2020-10-05 ENCOUNTER — Ambulatory Visit
Admission: EM | Admit: 2020-10-05 | Discharge: 2020-10-05 | Disposition: A | Discharge status: Home / Self Care | Payer: Medicare Other | Attending: Family Medicine | Admitting: Family Medicine

## 2020-10-05 ENCOUNTER — Other Ambulatory Visit: Payer: Self-pay

## 2020-10-05 DIAGNOSIS — R062 Wheezing: Secondary | ICD-10-CM

## 2020-10-05 DIAGNOSIS — H66001 Acute suppurative otitis media without spontaneous rupture of ear drum, right ear: Secondary | ICD-10-CM

## 2020-10-05 DIAGNOSIS — J0140 Acute pansinusitis, unspecified: Secondary | ICD-10-CM

## 2020-10-05 MED ORDER — AMOXICILLIN-POT CLAVULANATE 875-125 MG PO TABS: 1.0000 | ORAL_TABLET | Freq: Two times a day (BID) | ORAL | 0 refills | Status: AC

## 2020-10-05 MED ORDER — PREDNISONE 20 MG PO TABS
40.0000 mg | ORAL_TABLET | Freq: Every day | ORAL | 0 refills | Status: AC
Start: 2020-10-05 — End: 2020-10-10

## 2020-10-05 NOTE — ED Triage Notes (Addendum)
Congestion, cough and sore throat x 1 week. Family members sick with same s/s and were neg for covid.
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
## 2020-10-05 NOTE — Discharge Instructions (Signed)
I have sent in Augmentin for you to take twice a day for 7 days.  I have sent in prednisone for you to take 2 tablets by mouth in the morning for 5 days  Follow up with this office or with primary care if symptoms are persisting.  Follow up in the ER for high fever, trouble swallowing, trouble breathing, other concerning symptoms.

## 2020-10-07 ENCOUNTER — Encounter: Payer: Self-pay | Admitting: Emergency Medicine

## 2020-10-07 ENCOUNTER — Other Ambulatory Visit: Payer: Self-pay

## 2020-10-07 ENCOUNTER — Ambulatory Visit
Admission: EM | Admit: 2020-10-07 | Discharge: 2020-10-07 | Disposition: A | Discharge status: Home / Self Care | Payer: Medicare Other | Attending: Emergency Medicine | Admitting: Emergency Medicine

## 2020-10-07 DIAGNOSIS — H5789 Other specified disorders of eye and adnexa: Secondary | ICD-10-CM | POA: Diagnosis not present

## 2020-10-07 MED ORDER — HYPROMELLOSE 0.3 % OP GEL: OPHTHALMIC | 0 refills | Status: AC | PRN

## 2020-10-07 NOTE — ED Triage Notes (Signed)
Right eye irritation and draining since last night.
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
## 2020-10-07 NOTE — Discharge Instructions (Signed)
Eye drops for irritation Return or follow up with PCP if symptoms persists such as fever, chills, redness, swelling, eye pain, painful eye movements, vision changes, etc..Marland Kitchen

## 2020-10-08 ENCOUNTER — Telehealth: Payer: Self-pay | Admitting: Emergency Medicine

## 2020-10-08 ENCOUNTER — Encounter: Payer: Self-pay | Admitting: Emergency Medicine

## 2020-10-08 ENCOUNTER — Other Ambulatory Visit: Payer: Self-pay

## 2020-10-08 ENCOUNTER — Ambulatory Visit
Admission: EM | Admit: 2020-10-08 | Discharge: 2020-10-08 | Disposition: A | Discharge status: Home / Self Care | Payer: Medicare Other

## 2020-10-08 MED ORDER — MOXIFLOXACIN HCL 0.5 % OP SOLN: 1.0000 [drp] | Freq: Three times a day (TID) | OPHTHALMIC | 0 refills | Status: AC

## 2020-10-08 NOTE — ED Triage Notes (Signed)
Right eye red and inflamed x 3 to 4 days.  Was seen yesterday for the same.

## 2020-10-08 NOTE — Telephone Encounter (Signed)
Patient returns requesting antibiotic eye drop.  Patient is concerned for infection.  Antibiotic eye drops sent to pharmacy on file.

## 2022-04-17 DIAGNOSIS — Z136 Encounter for screening for cardiovascular disorders: Secondary | ICD-10-CM | POA: Diagnosis not present

## 2022-04-17 DIAGNOSIS — R5383 Other fatigue: Secondary | ICD-10-CM | POA: Diagnosis not present

## 2022-04-17 DIAGNOSIS — J449 Chronic obstructive pulmonary disease, unspecified: Secondary | ICD-10-CM | POA: Diagnosis not present

## 2022-04-17 DIAGNOSIS — E46 Unspecified protein-calorie malnutrition: Secondary | ICD-10-CM | POA: Diagnosis not present

## 2022-04-17 DIAGNOSIS — R636 Underweight: Secondary | ICD-10-CM | POA: Diagnosis not present
